# Patient Record
Sex: Female | Born: 1940 | Race: White | Hispanic: No | Marital: Married | Smoking: Never smoker
Health system: Southern US, Community
[De-identification: ages and names within clinical notes are randomized; demographics above are authoritative.]

## PROBLEM LIST (undated history)

## (undated) DIAGNOSIS — K219 Gastro-esophageal reflux disease without esophagitis: Secondary | ICD-10-CM

## (undated) DIAGNOSIS — D72819 Decreased white blood cell count, unspecified: Secondary | ICD-10-CM

## (undated) DIAGNOSIS — R748 Abnormal levels of other serum enzymes: Secondary | ICD-10-CM

## (undated) DIAGNOSIS — D649 Anemia, unspecified: Secondary | ICD-10-CM

## (undated) DIAGNOSIS — E785 Hyperlipidemia, unspecified: Secondary | ICD-10-CM

## (undated) DIAGNOSIS — M810 Age-related osteoporosis without current pathological fracture: Secondary | ICD-10-CM

## (undated) DIAGNOSIS — J309 Allergic rhinitis, unspecified: Secondary | ICD-10-CM

## (undated) DIAGNOSIS — G510 Bell's palsy: Secondary | ICD-10-CM

## (undated) DIAGNOSIS — J189 Pneumonia, unspecified organism: Secondary | ICD-10-CM

## (undated) DIAGNOSIS — I10 Essential (primary) hypertension: Secondary | ICD-10-CM

## (undated) HISTORY — PX: OTHER SURGICAL HISTORY: SHX169

## (undated) HISTORY — PX: NASAL FRACTURE SURGERY: SHX718

## (undated) HISTORY — PX: TUBAL LIGATION: SHX77

## (undated) HISTORY — PX: COLONOSCOPY: SHX174

## (undated) HISTORY — PX: TONSILLECTOMY: SUR1361

---

## 2005-08-22 ENCOUNTER — Ambulatory Visit: Payer: Self-pay | Admitting: Internal Medicine

## 2006-05-07 ENCOUNTER — Ambulatory Visit: Payer: Self-pay | Admitting: Internal Medicine

## 2006-10-03 ENCOUNTER — Ambulatory Visit: Payer: Self-pay | Admitting: Internal Medicine

## 2006-10-10 ENCOUNTER — Ambulatory Visit: Payer: Self-pay | Admitting: Internal Medicine

## 2008-01-20 ENCOUNTER — Ambulatory Visit: Payer: Self-pay | Admitting: Internal Medicine

## 2009-01-25 ENCOUNTER — Ambulatory Visit: Payer: Self-pay | Admitting: Internal Medicine

## 2010-03-15 ENCOUNTER — Ambulatory Visit: Payer: Self-pay | Admitting: Internal Medicine

## 2010-03-16 ENCOUNTER — Ambulatory Visit: Payer: Self-pay | Admitting: Internal Medicine

## 2010-06-27 ENCOUNTER — Ambulatory Visit: Payer: Self-pay | Admitting: Unknown Physician Specialty

## 2011-03-19 ENCOUNTER — Ambulatory Visit: Payer: Self-pay | Admitting: Internal Medicine

## 2012-03-20 ENCOUNTER — Ambulatory Visit: Payer: Self-pay | Admitting: Internal Medicine

## 2013-03-23 ENCOUNTER — Ambulatory Visit: Payer: Self-pay | Admitting: Internal Medicine

## 2014-03-24 ENCOUNTER — Ambulatory Visit: Payer: Self-pay | Admitting: Internal Medicine

## 2015-03-11 ENCOUNTER — Other Ambulatory Visit: Payer: Self-pay | Admitting: Internal Medicine

## 2015-03-11 DIAGNOSIS — Z1231 Encounter for screening mammogram for malignant neoplasm of breast: Secondary | ICD-10-CM

## 2015-03-28 ENCOUNTER — Ambulatory Visit
Admission: RE | Admit: 2015-03-28 | Discharge: 2015-03-28 | Disposition: A | Discharge status: Home / Self Care | Payer: BC Managed Care – PPO | Source: Ambulatory Visit | Attending: Internal Medicine | Admitting: Internal Medicine

## 2015-03-28 ENCOUNTER — Other Ambulatory Visit: Payer: Self-pay | Admitting: Internal Medicine

## 2015-03-28 DIAGNOSIS — Z1231 Encounter for screening mammogram for malignant neoplasm of breast: Secondary | ICD-10-CM | POA: Diagnosis not present

## 2016-02-28 ENCOUNTER — Other Ambulatory Visit: Payer: Self-pay | Admitting: Internal Medicine

## 2016-02-28 DIAGNOSIS — Z1231 Encounter for screening mammogram for malignant neoplasm of breast: Secondary | ICD-10-CM

## 2016-03-29 ENCOUNTER — Ambulatory Visit: Payer: Medicare Other

## 2016-03-29 ENCOUNTER — Ambulatory Visit
Admission: RE | Admit: 2016-03-29 | Discharge: 2016-03-29 | Disposition: A | Discharge status: Home / Self Care | Payer: BC Managed Care – PPO | Source: Ambulatory Visit | Attending: Internal Medicine | Admitting: Internal Medicine

## 2016-03-29 DIAGNOSIS — Z1231 Encounter for screening mammogram for malignant neoplasm of breast: Secondary | ICD-10-CM | POA: Insufficient documentation

## 2016-04-18 ENCOUNTER — Encounter: Payer: Self-pay | Admitting: *Deleted

## 2016-04-24 ENCOUNTER — Encounter: Payer: Self-pay | Admitting: *Deleted

## 2016-04-24 ENCOUNTER — Ambulatory Visit
Admission: RE | Admit: 2016-04-24 | Discharge: 2016-04-24 | Disposition: A | Discharge status: Home / Self Care | Payer: BC Managed Care – PPO | Source: Ambulatory Visit | Attending: Ophthalmology | Admitting: Ophthalmology

## 2016-04-24 ENCOUNTER — Ambulatory Visit: Payer: BC Managed Care – PPO | Admitting: Anesthesiology

## 2016-04-24 ENCOUNTER — Encounter
Admission: RE | Disposition: A | Discharge status: Home / Self Care | Payer: Self-pay | Source: Ambulatory Visit | Attending: Ophthalmology

## 2016-04-24 DIAGNOSIS — K219 Gastro-esophageal reflux disease without esophagitis: Secondary | ICD-10-CM | POA: Diagnosis not present

## 2016-04-24 DIAGNOSIS — H2512 Age-related nuclear cataract, left eye: Secondary | ICD-10-CM | POA: Insufficient documentation

## 2016-04-24 DIAGNOSIS — I10 Essential (primary) hypertension: Secondary | ICD-10-CM | POA: Diagnosis not present

## 2016-04-24 DIAGNOSIS — Z862 Personal history of diseases of the blood and blood-forming organs and certain disorders involving the immune mechanism: Secondary | ICD-10-CM | POA: Insufficient documentation

## 2016-04-24 HISTORY — DX: Gastro-esophageal reflux disease without esophagitis: K21.9

## 2016-04-24 HISTORY — DX: Essential (primary) hypertension: I10

## 2016-04-24 HISTORY — PX: CATARACT EXTRACTION W/PHACO: SHX586

## 2016-04-24 HISTORY — DX: Anemia, unspecified: D64.9

## 2016-04-24 SURGERY — PHACOEMULSIFICATION, CATARACT, WITH IOL INSERTION
Anesthesia: Monitor Anesthesia Care | Site: Eye | Laterality: Left | Wound class: Clean

## 2016-04-24 MED ORDER — CEFUROXIME OPHTHALMIC INJECTION 1 MG/0.1 ML
INJECTION | OPHTHALMIC | Status: DC | PRN
  Administered 2016-04-24: 0.1 mL via INTRACAMERAL

## 2016-04-24 MED ORDER — NA CHONDROIT SULF-NA HYALURON 40-17 MG/ML IO SOLN
INTRAOCULAR | Status: AC
  Filled 2016-04-24: qty 1

## 2016-04-24 MED ORDER — MOXIFLOXACIN HCL 0.5 % OP SOLN
OPHTHALMIC | Status: DC | PRN
  Administered 2016-04-24: 1 [drp] via OPHTHALMIC

## 2016-04-24 MED ORDER — EPINEPHRINE HCL 1 MG/ML IJ SOLN
INTRAOCULAR | Status: DC | PRN
  Administered 2016-04-24: 10:00:00 via OPHTHALMIC

## 2016-04-24 MED ORDER — FENTANYL CITRATE (PF) 100 MCG/2ML IJ SOLN
INTRAMUSCULAR | Status: DC | PRN
  Administered 2016-04-24: 50 ug via INTRAVENOUS

## 2016-04-24 MED ORDER — EPINEPHRINE HCL 1 MG/ML IJ SOLN
INTRAMUSCULAR | Status: AC
  Filled 2016-04-24: qty 1

## 2016-04-24 MED ORDER — ARMC OPHTHALMIC DILATING GEL
1.0000 "application " | OPHTHALMIC | Status: AC | PRN
  Administered 2016-04-24 (×2): 1 via OPHTHALMIC

## 2016-04-24 MED ORDER — POVIDONE-IODINE 5 % OP SOLN
1.0000 "application " | Freq: Once | OPHTHALMIC | Status: AC
  Administered 2016-04-24: 1 via OPHTHALMIC

## 2016-04-24 MED ORDER — POVIDONE-IODINE 5 % OP SOLN
OPHTHALMIC | Status: AC
  Administered 2016-04-24: 1 via OPHTHALMIC
  Filled 2016-04-24: qty 30

## 2016-04-24 MED ORDER — MOXIFLOXACIN HCL 0.5 % OP SOLN: 1.0000 [drp] | OPHTHALMIC | Status: DC | PRN

## 2016-04-24 MED ORDER — NA CHONDROIT SULF-NA HYALURON 40-17 MG/ML IO SOLN
INTRAOCULAR | Status: DC | PRN
  Administered 2016-04-24: 1 mL via INTRAOCULAR

## 2016-04-24 MED ORDER — MOXIFLOXACIN HCL 0.5 % OP SOLN
OPHTHALMIC | Status: AC
  Filled 2016-04-24: qty 3

## 2016-04-24 MED ORDER — TETRACAINE HCL 0.5 % OP SOLN
1.0000 [drp] | Freq: Once | OPHTHALMIC | Status: AC
  Administered 2016-04-24: 1 [drp] via OPHTHALMIC

## 2016-04-24 MED ORDER — MIDAZOLAM HCL 2 MG/2ML IJ SOLN
INTRAMUSCULAR | Status: DC | PRN
  Administered 2016-04-24 (×2): 1 mg via INTRAVENOUS

## 2016-04-24 MED ORDER — CARBACHOL 0.01 % IO SOLN
INTRAOCULAR | Status: DC | PRN
  Administered 2016-04-24: 0.5 mL via INTRAOCULAR

## 2016-04-24 MED ORDER — CEFUROXIME OPHTHALMIC INJECTION 1 MG/0.1 ML
INJECTION | OPHTHALMIC | Status: AC
  Filled 2016-04-24: qty 0.1

## 2016-04-24 MED ORDER — SODIUM CHLORIDE 0.9 % IV SOLN
INTRAVENOUS | Status: DC
  Administered 2016-04-24 (×2): via INTRAVENOUS

## 2016-04-24 MED ORDER — TETRACAINE HCL 0.5 % OP SOLN
OPHTHALMIC | Status: AC
  Administered 2016-04-24: 1 [drp] via OPHTHALMIC
  Filled 2016-04-24: qty 2

## 2016-04-24 SURGICAL SUPPLY — 21 items
CANNULA ANT/CHMB 27GA (MISCELLANEOUS) ×2 IMPLANT
CUP MEDICINE 2OZ PLAST GRAD ST (MISCELLANEOUS) ×2 IMPLANT
GLOVE BIO SURGEON STRL SZ8 (GLOVE) ×2 IMPLANT
GLOVE BIOGEL M 6.5 STRL (GLOVE) ×2 IMPLANT
GLOVE SURG LX 8.0 MICRO (GLOVE) ×1
GLOVE SURG LX STRL 8.0 MICRO (GLOVE) ×1 IMPLANT
GOWN STRL REUS W/ TWL LRG LVL3 (GOWN DISPOSABLE) ×2 IMPLANT
GOWN STRL REUS W/TWL LRG LVL3 (GOWN DISPOSABLE) ×2
LENS IOL TECNIS SYMFONY 18.0 (Intraocular Lens) ×2 IMPLANT
PACK CATARACT (MISCELLANEOUS) ×2 IMPLANT
PACK CATARACT BRASINGTON LX (MISCELLANEOUS) ×2 IMPLANT
PACK EYE AFTER SURG (MISCELLANEOUS) ×2 IMPLANT
SOL BSS BAG (MISCELLANEOUS) ×2
SOL PREP PVP 2OZ (MISCELLANEOUS) ×2
SOLUTION BSS BAG (MISCELLANEOUS) ×1 IMPLANT
SOLUTION PREP PVP 2OZ (MISCELLANEOUS) ×1 IMPLANT
SYR 3ML LL SCALE MARK (SYRINGE) ×2 IMPLANT
SYR 5ML LL (SYRINGE) ×2 IMPLANT
SYR TB 1ML 27GX1/2 LL (SYRINGE) ×2 IMPLANT
WATER STERILE IRR 1000ML POUR (IV SOLUTION) ×2 IMPLANT
WIPE NON LINTING 3.25X3.25 (MISCELLANEOUS) ×2 IMPLANT

## 2016-04-24 NOTE — Discharge Instructions (Signed)
Eye Surgery Discharge Instructions  Expect mild scratchy sensation or mild soreness. DO NOT RUB YOUR EYE!  The day of surgery:  Minimal physical activity, but bed rest is not required  No reading, computer work, or close hand work  No bending, lifting, or straining.  May watch TV  For 24 hours:  No driving, legal decisions, or alcoholic beverages  Safety precautions  Eat anything you prefer: It is better to start with liquids, then soup then solid foods.  _____ Eye patch should be worn until postoperative exam tomorrow.  ____ Solar shield eyeglasses should be worn for comfort in the sunlight/patch while sleeping  Resume all regular medications including aspirin or Coumadin if these were discontinued prior to surgery. You may shower, bathe, shave, or wash your hair. Tylenol may be taken for mild discomfort.  Call your doctor if you experience significant pain, nausea, or vomiting, fever > 101 or other signs of infection. [phone] or [phone] Specific instructions:  Follow-up Information    Follow up with PORFILIO,WILLIAM LOUIS, MD In 1 day.   Specialty:  Ophthalmology   Why:  May 31` at 8:30am   Contact information:   [address] [place] [address] [phone]

## 2016-04-24 NOTE — Transfer of Care (Signed)
Immediate Anesthesia Transfer of Care Note  Patient: Shelly Heath  Procedure(s) Performed: Procedure(s) with comments: CATARACT EXTRACTION PHACO AND INTRAOCULAR LENS PLACEMENT (IOC) (Left) - US 50.9 AP% 16.3 CDE 8.32 Fluid Pack Lot # [id number]  Patient Location: PACU  Anesthesia Type:MAC  Level of Consciousness: awake, alert  and oriented  Airway & Oxygen Therapy: Patient Spontanous Breathing  Post-op Assessment: Report given to RN and Post -op Vital signs reviewed and stable  Post vital signs: Reviewed and stable  Last Vitals:  Filed Vitals:   04/24/16 0907  BP: 166/61  Pulse: 60  Temp: 36.9 C  Resp: 16    Last Pain: There were no vitals filed for this visit.       Complications: No apparent anesthesia complications

## 2016-04-24 NOTE — Anesthesia Postprocedure Evaluation (Signed)
Anesthesia Post Note  Patient: Shelly DawsonJudy W Heath  Procedure(s) Performed: Procedure(s) (LRB): CATARACT EXTRACTION PHACO AND INTRAOCULAR LENS PLACEMENT (IOC) (Left)  Patient location during evaluation: PACU Anesthesia Type: MAC Level of consciousness: awake and alert Pain management: pain level controlled Vital Signs Assessment: post-procedure vital signs reviewed and stable Respiratory status: spontaneous breathing, nonlabored ventilation, respiratory function stable and patient connected to nasal cannula oxygen Cardiovascular status: stable and blood pressure returned to baseline Anesthetic complications: no    Last Vitals:  Filed Vitals:   04/24/16 1037 04/24/16 1048  BP: 157/53 146/88  Pulse: 55   Temp: 36.4 C   Resp: 16     Last Pain: There were no vitals filed for this visit.               Cleda MccreedyJoseph K Piscitello

## 2016-04-24 NOTE — H&P (Signed)
  All labs reviewed. Abnormal studies sent to patients PCP when indicated.  Previous H&P reviewed, patient examined, there are NO CHANGES.  Shelly Heath LOUIS5/30/201710:06 AM

## 2016-04-24 NOTE — Op Note (Signed)
PREOPERATIVE DIAGNOSIS:  Nuclear sclerotic cataract of the left eye.   POSTOPERATIVE DIAGNOSIS:  NUCLEAR SCLEROTIC CATARACT LEFT EYE   OPERATIVE PROCEDURE:  Procedure(s): CATARACT EXTRACTION PHACO AND INTRAOCULAR LENS PLACEMENT (IOC)   SURGEON:  Galen ManilaWilliam Redell Nazir, MD.   ANESTHESIA:   Anesthesiologist: Rosaria FerriesJoseph K Piscitello, MD CRNA: Evelena Peateborah Ferrero-Conover, CRNA  1.      Managed anesthesia care. 2.      Topical tetracaine drops followed by 2% Xylocaine jelly applied in the preoperative holding area.   COMPLICATIONS:  None.   TECHNIQUE:   Stop and chop   DESCRIPTION OF PROCEDURE:  The patient was examined and consented in the preoperative holding area where the aforementioned topical anesthesia was applied to the left eye and then brought back to the Operating Room where the left eye was prepped and draped in the usual sterile ophthalmic fashion and a lid speculum was placed. A paracentesis was created with the side port blade and the anterior chamber was filled with viscoelastic. A near clear corneal incision was performed with the steel keratome. A continuous curvilinear capsulorrhexis was performed with a cystotome followed by the capsulorrhexis forceps. Hydrodissection and hydrodelineation were carried out with BSS on a blunt cannula. The lens was removed in a stop and chop  technique and the remaining cortical material was removed with the irrigation-aspiration handpiece. The capsular bag was inflated with viscoelastic and the Technis ZCB00 lens was placed in the capsular bag without complication. The remaining viscoelastic was removed from the eye with the irrigation-aspiration handpiece. The wounds were hydrated. The anterior chamber was flushed with Miostat and the eye was inflated to physiologic pressure. 0.1 mL of cefuroxime concentration 10 mg/mL was placed in the anterior chamber. The wounds were found to be water tight. The eye was dressed with Vigamox. The patient was given protective  glasses to wear throughout the day and a shield with which to sleep tonight. The patient was also given drops with which to begin a drop regimen today and will follow-up with me in one day.  Implant Name Type Inv. Item Serial No. Manufacturer Lot No. LRB No. Used  LENS IOL SYMFONY 18.0 - [id number] Intraocular Lens LENS IOL SYMFONY 18.0 [id number] AMO   Left 1   Procedure(s) with comments: CATARACT EXTRACTION PHACO AND INTRAOCULAR LENS PLACEMENT (IOC) (Left) - US 50.9 AP% 16.3 CDE 8.32 Fluid Pack Lot # [id number] H  Electronically signed: Adenike Shidler LOUIS 04/24/2016 10:35 AM

## 2016-04-24 NOTE — Anesthesia Preprocedure Evaluation (Signed)
Anesthesia Evaluation  Patient identified by MRN, date of birth, ID band Patient awake    Reviewed: Allergy & Precautions, H&P , NPO status , Patient's Chart, lab work & pertinent test results  History of Anesthesia Complications Negative for: history of anesthetic complications  Airway Mallampati: III  TM Distance: <3 FB Neck ROM: limited    Dental  (+) Poor Dentition, Chipped, Caps   Pulmonary neg pulmonary ROS, neg shortness of breath,    Pulmonary exam normal breath sounds clear to auscultation       Cardiovascular Exercise Tolerance: Good hypertension, (-) angina(-) Past MI and (-) DOE Normal cardiovascular exam Rhythm:regular Rate:Normal     Neuro/Psych negative neurological ROS  negative psych ROS   GI/Hepatic Neg liver ROS, GERD  Controlled,  Endo/Other  negative endocrine ROS  Renal/GU negative Renal ROS  negative genitourinary   Musculoskeletal   Abdominal   Peds  Hematology negative hematology ROS (+)   Anesthesia Other Findings Past Medical History:   Hypertension                                                 GERD (gastroesophageal reflux disease)                       Anemia                                                      Past Surgical History:   smr                                                           TUBAL LIGATION                                                COLONOSCOPY                                                  BMI    Body Mass Index   23.16 kg/m 2      Reproductive/Obstetrics negative OB ROS                             Anesthesia Physical Anesthesia Plan  ASA: III  Anesthesia Plan: MAC   Post-op Pain Management:    Induction:   Airway Management Planned:   Additional Equipment:   Intra-op Plan:   Post-operative Plan:   Informed Consent: I have reviewed the patients History and Physical, chart, labs and discussed the procedure  including the risks, benefits and alternatives for the proposed anesthesia with the patient or authorized representative who has indicated his/her understanding and acceptance.   Dental Advisory Given  Plan Discussed with:  Anesthesiologist, CRNA and Surgeon  Anesthesia Plan Comments:         Anesthesia Quick Evaluation

## 2016-05-07 ENCOUNTER — Encounter: Payer: Self-pay | Admitting: Anesthesiology

## 2016-05-07 ENCOUNTER — Ambulatory Visit
Admission: RE | Admit: 2016-05-07 | Discharge: 2016-05-07 | Disposition: A | Discharge status: Home / Self Care | Payer: BC Managed Care – PPO | Source: Ambulatory Visit | Attending: Unknown Physician Specialty | Admitting: Unknown Physician Specialty

## 2016-05-07 ENCOUNTER — Ambulatory Visit: Payer: BC Managed Care – PPO | Admitting: Anesthesiology

## 2016-05-07 ENCOUNTER — Encounter
Admission: RE | Disposition: A | Discharge status: Home / Self Care | Payer: Self-pay | Source: Ambulatory Visit | Attending: Unknown Physician Specialty

## 2016-05-07 DIAGNOSIS — I10 Essential (primary) hypertension: Secondary | ICD-10-CM | POA: Diagnosis not present

## 2016-05-07 DIAGNOSIS — Z7982 Long term (current) use of aspirin: Secondary | ICD-10-CM | POA: Diagnosis not present

## 2016-05-07 DIAGNOSIS — Z86010 Personal history of colonic polyps: Secondary | ICD-10-CM | POA: Diagnosis not present

## 2016-05-07 DIAGNOSIS — Z79899 Other long term (current) drug therapy: Secondary | ICD-10-CM | POA: Insufficient documentation

## 2016-05-07 DIAGNOSIS — K219 Gastro-esophageal reflux disease without esophagitis: Secondary | ICD-10-CM | POA: Diagnosis not present

## 2016-05-07 DIAGNOSIS — K5730 Diverticulosis of large intestine without perforation or abscess without bleeding: Secondary | ICD-10-CM | POA: Insufficient documentation

## 2016-05-07 DIAGNOSIS — Z801 Family history of malignant neoplasm of trachea, bronchus and lung: Secondary | ICD-10-CM | POA: Diagnosis not present

## 2016-05-07 DIAGNOSIS — Z800 Family history of malignant neoplasm of digestive organs: Secondary | ICD-10-CM | POA: Insufficient documentation

## 2016-05-07 DIAGNOSIS — Z1211 Encounter for screening for malignant neoplasm of colon: Secondary | ICD-10-CM | POA: Insufficient documentation

## 2016-05-07 DIAGNOSIS — E785 Hyperlipidemia, unspecified: Secondary | ICD-10-CM | POA: Insufficient documentation

## 2016-05-07 DIAGNOSIS — K648 Other hemorrhoids: Secondary | ICD-10-CM | POA: Diagnosis not present

## 2016-05-07 DIAGNOSIS — Z8041 Family history of malignant neoplasm of ovary: Secondary | ICD-10-CM | POA: Diagnosis not present

## 2016-05-07 DIAGNOSIS — M810 Age-related osteoporosis without current pathological fracture: Secondary | ICD-10-CM | POA: Diagnosis not present

## 2016-05-07 HISTORY — DX: Decreased white blood cell count, unspecified: D72.819

## 2016-05-07 HISTORY — DX: Hyperlipidemia, unspecified: E78.5

## 2016-05-07 HISTORY — DX: Age-related osteoporosis without current pathological fracture: M81.0

## 2016-05-07 HISTORY — DX: Allergic rhinitis, unspecified: J30.9

## 2016-05-07 HISTORY — DX: Abnormal levels of other serum enzymes: R74.8

## 2016-05-07 HISTORY — PX: COLONOSCOPY: SHX5424

## 2016-05-07 HISTORY — DX: Bell's palsy: G51.0

## 2016-05-07 SURGERY — COLONOSCOPY
Anesthesia: General

## 2016-05-07 MED ORDER — MIDAZOLAM HCL 5 MG/5ML IJ SOLN
INTRAMUSCULAR | Status: DC | PRN
  Administered 2016-05-07: .5 mg via INTRAVENOUS
  Administered 2016-05-07: 0.5 mg via INTRAVENOUS

## 2016-05-07 MED ORDER — LIDOCAINE HCL (PF) 1 % IJ SOLN
INTRAMUSCULAR | Status: AC
  Administered 2016-05-07: 0.03 mL via INTRADERMAL
  Filled 2016-05-07: qty 2

## 2016-05-07 MED ORDER — SODIUM CHLORIDE 0.9 % IV SOLN
INTRAVENOUS | Status: DC
  Administered 2016-05-07: 1000 mL via INTRAVENOUS
  Administered 2016-05-07 (×2): via INTRAVENOUS

## 2016-05-07 MED ORDER — EPHEDRINE SULFATE 50 MG/ML IJ SOLN
INTRAMUSCULAR | Status: DC | PRN
  Administered 2016-05-07: 10 mg via INTRAVENOUS

## 2016-05-07 MED ORDER — SODIUM CHLORIDE 0.9 % IV SOLN: INTRAVENOUS | Status: DC

## 2016-05-07 MED ORDER — FENTANYL CITRATE (PF) 100 MCG/2ML IJ SOLN
INTRAMUSCULAR | Status: DC | PRN
  Administered 2016-05-07: 50 ug via INTRAVENOUS

## 2016-05-07 MED ORDER — LIDOCAINE HCL (PF) 1 % IJ SOLN
2.0000 mL | Freq: Once | INTRAMUSCULAR | Status: AC
  Administered 2016-05-07: 0.03 mL via INTRADERMAL

## 2016-05-07 MED ORDER — PROPOFOL 500 MG/50ML IV EMUL
INTRAVENOUS | Status: DC | PRN
  Administered 2016-05-07: 180 ug/kg/min via INTRAVENOUS

## 2016-05-07 NOTE — Anesthesia Postprocedure Evaluation (Signed)
Anesthesia Post Note  Patient: Shelly DawsonJudy W Barraclough  Procedure(s) Performed: Procedure(s) (LRB): COLONOSCOPY (N/A)  Patient location during evaluation: Endoscopy Anesthesia Type: General Level of consciousness: awake and alert Pain management: pain level controlled Vital Signs Assessment: post-procedure vital signs reviewed and stable Respiratory status: spontaneous breathing, nonlabored ventilation, respiratory function stable and patient connected to nasal cannula oxygen Cardiovascular status: blood pressure returned to baseline and stable Postop Assessment: no signs of nausea or vomiting Anesthetic complications: no    Last Vitals:  Filed Vitals:   05/07/16 1452 05/07/16 1503  BP: 119/62 128/60  Pulse: 63 65  Temp:    Resp: 12 18    Last Pain: There were no vitals filed for this visit.               Lenard SimmerAndrew Daiveon Markman

## 2016-05-07 NOTE — Op Note (Signed)
[facility] Gastroenterology Patient Name: Shelly Heath Procedure Date: 05/07/2016 1:41 PM MRN: [id number] Account #: [id number] Date of Birth: 04/02/1941 Admit Type: Outpatient Age: 75 Room: [facility] ENDO ROOM 1 Gender: Female Note Status: Finalized Procedure:            Colonoscopy Indications:          High risk colon cancer surveillance: Personal history                        of colonic polyps Providers:            Scot Jun, MD Referring MD:         No Local Md, MD (Referring MD) Medicines:            Propofol per Anesthesia Complications:        No immediate complications. Procedure:            Pre-Anesthesia Assessment:                       - After reviewing the risks and benefits, the patient                        was deemed in satisfactory condition to undergo the                        procedure.                       After obtaining informed consent, the colonoscope was                        passed under direct vision. Throughout the procedure,                        the patient's blood pressure, pulse, and oxygen                        saturations were monitored continuously. The                        Colonoscope was introduced through the anus and                        advanced to the the cecum, identified by appendiceal                        orifice and ileocecal valve. The colonoscopy was                        somewhat difficult due to a tortuous colon. Successful                        completion of the procedure was aided by applying                        abdominal pressure. The patient tolerated the procedure                        well. The quality of the bowel preparation was good. Findings:      A few small-mouthed diverticula were found in the sigmoid colon.  Internal hemorrhoids were found during endoscopy. The hemorrhoids were       small and Grade I (internal hemorrhoids that do not prolapse).      The exam was otherwise  without abnormality. Impression:           - Diverticulosis in the sigmoid colon.                       - Internal hemorrhoids.                       - The examination was otherwise normal.                       - No specimens collected. Recommendation:       - Repeat colonoscopy in 5 years for surveillance. Scot Junobert T Cedrik Heindl, MD 05/07/2016 2:14:33 PM This report has been signed electronically. Number of Addenda: 0 Note Initiated On: 05/07/2016 1:41 PM Scope Withdrawal Time: 0 hours 10 minutes 8 seconds  Total Procedure Duration: 0 hours 19 minutes 12 seconds       [facility]

## 2016-05-07 NOTE — Transfer of Care (Signed)
Immediate Anesthesia Transfer of Care Note  Patient: Shelly DawsonJudy W Halloran  Procedure(s) Performed: Procedure(s): COLONOSCOPY (N/A)  Patient Location: PACU  Anesthesia Type:General  Level of Consciousness: sedated  Airway & Oxygen Therapy: Patient Spontanous Breathing and Patient connected to nasal cannula oxygen  Post-op Assessment: Report given to RN and Post -op Vital signs reviewed and stable  Post vital signs: Reviewed and stable  Last Vitals:  Filed Vitals:   05/07/16 1237  BP: 151/87  Temp: 36 C  Resp: 16    Last Pain: There were no vitals filed for this visit.       Complications: No apparent anesthesia complications

## 2016-05-07 NOTE — Anesthesia Preprocedure Evaluation (Signed)
Anesthesia Evaluation  Patient identified by MRN, date of birth, ID band Patient awake    Reviewed: Allergy & Precautions, H&P , NPO status , Patient's Chart, lab work & pertinent test results, reviewed documented beta blocker date and time   History of Anesthesia Complications Negative for: history of anesthetic complications  Airway Mallampati: III  TM Distance: >3 FB Neck ROM: full    Dental no notable dental hx. (+) Caps, Teeth Intact   Pulmonary neg pulmonary ROS,    Pulmonary exam normal breath sounds clear to auscultation       Cardiovascular Exercise Tolerance: Good hypertension, (-) angina(-) CAD, (-) Past MI, (-) Cardiac Stents and (-) CABG Normal cardiovascular exam(-) dysrhythmias (-) Valvular Problems/Murmurs Rhythm:regular Rate:Normal     Neuro/Psych neg Seizures  Neuromuscular disease (Bell's palsy 25 years ago, resolved) negative psych ROS   GI/Hepatic Neg liver ROS, GERD  ,  Endo/Other  negative endocrine ROS  Renal/GU negative Renal ROS  negative genitourinary   Musculoskeletal   Abdominal   Peds  Hematology negative hematology ROS (+)   Anesthesia Other Findings Past Medical History:   Hypertension                                                 GERD (gastroesophageal reflux disease)                       Anemia                                                       Allergic rhinitis                                            Bell's palsy                                                 Elevated liver enzymes                                       Hyperlipidemia                                               Leukopenia                                                   Osteoporosis, post-menopausal                                Reproductive/Obstetrics negative OB ROS  Anesthesia Physical Anesthesia Plan  ASA: II  Anesthesia Plan: General    Post-op Pain Management:    Induction:   Airway Management Planned:   Additional Equipment:   Intra-op Plan:   Post-operative Plan:   Informed Consent: I have reviewed the patients History and Physical, chart, labs and discussed the procedure including the risks, benefits and alternatives for the proposed anesthesia with the patient or authorized representative who has indicated his/her understanding and acceptance.   Dental Advisory Given  Plan Discussed with: Anesthesiologist, CRNA and Surgeon  Anesthesia Plan Comments:         Anesthesia Quick Evaluation

## 2016-05-07 NOTE — H&P (Signed)
Primary Care Physician:  No PCP Per Patient Primary Gastroenterologist:  Dr. Mechele CollinElliott  Pre-Procedure History & Physical: HPI:  Shelly Heath is a 75 y.o. female is here for an colonoscopy.   Past Medical History  Diagnosis Date  . Hypertension   . GERD (gastroesophageal reflux disease)   . Anemia   . Allergic rhinitis   . Bell's palsy   . Elevated liver enzymes   . Hyperlipidemia   . Leukopenia   . Osteoporosis, post-menopausal     Past Surgical History  Procedure Laterality Date  . Smr    . Tubal ligation    . Colonoscopy    . Cataract extraction w/phaco Left 04/24/2016    Procedure: CATARACT EXTRACTION PHACO AND INTRAOCULAR LENS PLACEMENT (IOC);  Surgeon: Galen ManilaWilliam Porfilio, MD;  Location: [facility] ORS;  Service: Ophthalmology;  Laterality: Left;  US 50.9 AP% 16.3 CDE 8.32 Fluid Pack Lot # [id number]  . Nasal fracture surgery      Prior to Admission medications   Medication Sig Start Date End Date Taking? Authorizing Provider  aspirin 81 MG tablet Take 81 mg by mouth daily.    Historical Provider, MD  BROMSITE 0.075 % SOLN Place 1 drop into the right eye daily. Start the day before procedure and continue 1 week after procedure 04/19/16   Historical Provider, MD  calcium carbonate (OSCAL) 1500 (600 Ca) MG TABS tablet Take 600 mg by mouth 2 (two) times daily.    Historical Provider, MD  cetirizine (ZYRTEC) 10 MG tablet Take 10 mg by mouth daily as needed.    Historical Provider, MD  losartan (COZAAR) 25 MG tablet Take 25 mg by mouth daily.    Historical Provider, MD  simvastatin (ZOCOR) 40 MG tablet Take 40 mg by mouth at bedtime.     Historical Provider, MD    Allergies as of 04/20/2016  . (No Known Allergies)    Family History  Problem Relation Age of Onset  . Lung cancer Mother   . Esophageal cancer Father   . Ovarian cancer Sister [id number]    Social History   Social History  . Marital Status: Married    Spouse Name: N/A  . Number of Children: N/A  . Years of  Education: N/A   Occupational History  . Not on file.   Social History Main Topics  . Smoking status: Never Smoker   . Smokeless tobacco: Not on file  . Alcohol Use: No  . Drug Use: Not on file  . Sexual Activity: Not on file   Other Topics Concern  . Not on file   Social History Narrative    Review of Systems: See HPI, otherwise negative ROS  Physical Exam: BP 151/87 mmHg  Temp(Src) 96.8 F (36 C) (Tympanic)  Resp 16  Ht 5\' 4"  (1.626 m)  Wt 61.236 kg (135 lb)  BMI 23.16 kg/m2  SpO2 100% General:   Alert,  pleasant and cooperative in NAD Head:  Normocephalic and atraumatic. Neck:  Supple; no masses or thyromegaly. Lungs:  Clear throughout to auscultation.    Heart:  Regular rate and rhythm. Abdomen:  Soft, nontender and nondistended. Normal bowel sounds, without guarding, and without rebound.   Neurologic:  Alert and  oriented x4;  grossly normal neurologically.  Impression/Plan: Shelly Heath is here for an [facility]  Risks, benefits, limitations, and alternatives regarding  colonoscopy have been reviewed with the patient.  Questions have been answered.  All parties  agreeable.   Lynnae Prude, MD  05/07/2016, 1:46 PM

## 2016-05-07 NOTE — Anesthesia Procedure Notes (Signed)
Date/Time: 05/07/2016 1:40 PM Performed by: Lenard SimmerKARENZ, Billy Rocco Pre-anesthesia Checklist: Patient identified, Emergency Drugs available, Suction available, Patient being monitored and Timeout performed Patient Re-evaluated:Patient Re-evaluated prior to inductionOxygen Delivery Method: Nasal cannula Intubation Type: IV induction

## 2016-05-09 ENCOUNTER — Encounter: Payer: Self-pay | Admitting: Unknown Physician Specialty

## 2016-05-15 ENCOUNTER — Encounter: Payer: Self-pay | Admitting: *Deleted

## 2016-05-15 ENCOUNTER — Ambulatory Visit: Payer: BC Managed Care – PPO | Admitting: Anesthesiology

## 2016-05-15 ENCOUNTER — Ambulatory Visit
Admission: RE | Admit: 2016-05-15 | Discharge: 2016-05-15 | Disposition: A | Discharge status: Home / Self Care | Payer: BC Managed Care – PPO | Source: Ambulatory Visit | Attending: Ophthalmology | Admitting: Ophthalmology

## 2016-05-15 ENCOUNTER — Encounter
Admission: RE | Disposition: A | Discharge status: Home / Self Care | Payer: Self-pay | Source: Ambulatory Visit | Attending: Ophthalmology

## 2016-05-15 DIAGNOSIS — Z7982 Long term (current) use of aspirin: Secondary | ICD-10-CM | POA: Insufficient documentation

## 2016-05-15 DIAGNOSIS — D649 Anemia, unspecified: Secondary | ICD-10-CM | POA: Diagnosis not present

## 2016-05-15 DIAGNOSIS — K219 Gastro-esophageal reflux disease without esophagitis: Secondary | ICD-10-CM | POA: Insufficient documentation

## 2016-05-15 DIAGNOSIS — H2511 Age-related nuclear cataract, right eye: Secondary | ICD-10-CM | POA: Diagnosis present

## 2016-05-15 DIAGNOSIS — Z9842 Cataract extraction status, left eye: Secondary | ICD-10-CM | POA: Diagnosis not present

## 2016-05-15 DIAGNOSIS — I10 Essential (primary) hypertension: Secondary | ICD-10-CM | POA: Insufficient documentation

## 2016-05-15 DIAGNOSIS — E7800 Pure hypercholesterolemia, unspecified: Secondary | ICD-10-CM | POA: Insufficient documentation

## 2016-05-15 DIAGNOSIS — Z79899 Other long term (current) drug therapy: Secondary | ICD-10-CM | POA: Diagnosis not present

## 2016-05-15 DIAGNOSIS — M810 Age-related osteoporosis without current pathological fracture: Secondary | ICD-10-CM | POA: Diagnosis not present

## 2016-05-15 HISTORY — PX: CATARACT EXTRACTION W/PHACO: SHX586

## 2016-05-15 HISTORY — DX: Pneumonia, unspecified organism: J18.9

## 2016-05-15 SURGERY — PHACOEMULSIFICATION, CATARACT, WITH IOL INSERTION
Anesthesia: Monitor Anesthesia Care | Site: Eye | Laterality: Right | Wound class: Clean

## 2016-05-15 MED ORDER — MOXIFLOXACIN HCL 0.5 % OP SOLN
OPHTHALMIC | Status: DC | PRN
  Administered 2016-05-15: 1 [drp] via OPHTHALMIC

## 2016-05-15 MED ORDER — MOXIFLOXACIN HCL 0.5 % OP SOLN: 1.0000 [drp] | OPHTHALMIC | Status: DC | PRN

## 2016-05-15 MED ORDER — TETRACAINE HCL 0.5 % OP SOLN
1.0000 [drp] | Freq: Once | OPHTHALMIC | Status: AC
  Administered 2016-05-15: 1 [drp] via OPHTHALMIC

## 2016-05-15 MED ORDER — MOXIFLOXACIN HCL 0.5 % OP SOLN
OPHTHALMIC | Status: AC
  Filled 2016-05-15: qty 3

## 2016-05-15 MED ORDER — CEFUROXIME OPHTHALMIC INJECTION 1 MG/0.1 ML
INJECTION | OPHTHALMIC | Status: AC
  Filled 2016-05-15: qty 0.1

## 2016-05-15 MED ORDER — ARMC OPHTHALMIC DILATING GEL
OPHTHALMIC | Status: AC
  Administered 2016-05-15: 1 via OPHTHALMIC
  Filled 2016-05-15: qty 0.25

## 2016-05-15 MED ORDER — MIDAZOLAM HCL 2 MG/2ML IJ SOLN
INTRAMUSCULAR | Status: DC | PRN
  Administered 2016-05-15 (×2): 1 mg via INTRAVENOUS

## 2016-05-15 MED ORDER — NA CHONDROIT SULF-NA HYALURON 40-17 MG/ML IO SOLN
INTRAOCULAR | Status: AC
  Filled 2016-05-15: qty 1

## 2016-05-15 MED ORDER — ARMC OPHTHALMIC DILATING GEL
1.0000 "application " | OPHTHALMIC | Status: DC | PRN
  Administered 2016-05-15: 1 via OPHTHALMIC

## 2016-05-15 MED ORDER — POVIDONE-IODINE 5 % OP SOLN
OPHTHALMIC | Status: AC
  Administered 2016-05-15: 1 via OPHTHALMIC
  Filled 2016-05-15: qty 30

## 2016-05-15 MED ORDER — FENTANYL CITRATE (PF) 100 MCG/2ML IJ SOLN: 25.0000 ug | INTRAMUSCULAR | Status: DC | PRN

## 2016-05-15 MED ORDER — SODIUM CHLORIDE 0.9 % IV SOLN
INTRAVENOUS | Status: DC
  Administered 2016-05-15: 10:00:00 via INTRAVENOUS

## 2016-05-15 MED ORDER — NA CHONDROIT SULF-NA HYALURON 40-17 MG/ML IO SOLN
INTRAOCULAR | Status: DC | PRN
  Administered 2016-05-15: 1 mL via INTRAOCULAR

## 2016-05-15 MED ORDER — EPINEPHRINE HCL 1 MG/ML IJ SOLN
INTRAMUSCULAR | Status: AC
  Filled 2016-05-15: qty 1

## 2016-05-15 MED ORDER — ONDANSETRON HCL 4 MG/2ML IJ SOLN: 4.0000 mg | Freq: Once | INTRAMUSCULAR | Status: DC | PRN

## 2016-05-15 MED ORDER — CARBACHOL 0.01 % IO SOLN
INTRAOCULAR | Status: DC | PRN
  Administered 2016-05-15: .5 mL via INTRAOCULAR

## 2016-05-15 MED ORDER — TETRACAINE HCL 0.5 % OP SOLN
OPHTHALMIC | Status: AC
  Administered 2016-05-15: 1 [drp] via OPHTHALMIC
  Filled 2016-05-15: qty 2

## 2016-05-15 MED ORDER — EPINEPHRINE HCL 1 MG/ML IJ SOLN
INTRAOCULAR | Status: DC | PRN
  Administered 2016-05-15: 1 mL via OPHTHALMIC

## 2016-05-15 MED ORDER — POVIDONE-IODINE 5 % OP SOLN
1.0000 "application " | Freq: Once | OPHTHALMIC | Status: AC
  Administered 2016-05-15: 1 via OPHTHALMIC

## 2016-05-15 MED ORDER — CEFUROXIME OPHTHALMIC INJECTION 1 MG/0.1 ML
INJECTION | OPHTHALMIC | Status: DC | PRN
  Administered 2016-05-15: .1 mL via INTRACAMERAL

## 2016-05-15 SURGICAL SUPPLY — 21 items
CANNULA ANT/CHMB 27GA (MISCELLANEOUS) ×2 IMPLANT
CUP MEDICINE 2OZ PLAST GRAD ST (MISCELLANEOUS) ×2 IMPLANT
GLOVE BIO SURGEON STRL SZ8 (GLOVE) ×2 IMPLANT
GLOVE BIOGEL M 6.5 STRL (GLOVE) ×2 IMPLANT
GLOVE SURG LX 8.0 MICRO (GLOVE) ×1
GLOVE SURG LX STRL 8.0 MICRO (GLOVE) ×1 IMPLANT
GOWN STRL REUS W/ TWL LRG LVL3 (GOWN DISPOSABLE) ×2 IMPLANT
GOWN STRL REUS W/TWL LRG LVL3 (GOWN DISPOSABLE) ×2
LENS IOL TECNIS SYMFONY 18.5 (Intraocular Lens) ×2 IMPLANT
PACK CATARACT (MISCELLANEOUS) ×2 IMPLANT
PACK CATARACT BRASINGTON LX (MISCELLANEOUS) ×2 IMPLANT
PACK EYE AFTER SURG (MISCELLANEOUS) ×2 IMPLANT
SOL BSS BAG (MISCELLANEOUS) ×2
SOL PREP PVP 2OZ (MISCELLANEOUS) ×2
SOLUTION BSS BAG (MISCELLANEOUS) ×1 IMPLANT
SOLUTION PREP PVP 2OZ (MISCELLANEOUS) ×1 IMPLANT
SYR 3ML LL SCALE MARK (SYRINGE) ×2 IMPLANT
SYR 5ML LL (SYRINGE) ×2 IMPLANT
SYR TB 1ML 27GX1/2 LL (SYRINGE) ×2 IMPLANT
WATER STERILE IRR 1000ML POUR (IV SOLUTION) ×2 IMPLANT
WIPE NON LINTING 3.25X3.25 (MISCELLANEOUS) ×2 IMPLANT

## 2016-05-15 NOTE — Anesthesia Preprocedure Evaluation (Signed)
Anesthesia Evaluation  Patient identified by MRN, date of birth, ID band Patient awake    Reviewed: Allergy & Precautions, NPO status , Patient's Chart, lab work & pertinent test results  Airway Mallampati: III       Dental  (+) Teeth Intact   Pulmonary neg pulmonary ROS,    breath sounds clear to auscultation       Cardiovascular Exercise Tolerance: Good hypertension, Pt. on medications  Rhythm:Regular     Neuro/Psych    GI/Hepatic Neg liver ROS, GERD  Medicated,  Endo/Other  negative endocrine ROS  Renal/GU negative Renal ROS  negative genitourinary   Musculoskeletal   Abdominal Normal abdominal exam  (+)   Peds negative pediatric ROS (+)  Hematology negative hematology ROS (+) anemia ,   Anesthesia Other Findings   Reproductive/Obstetrics negative OB ROS                             Anesthesia Physical Anesthesia Plan  ASA: II  Anesthesia Plan: MAC   Post-op Pain Management:    Induction: Intravenous  Airway Management Planned: Natural Airway and Nasal Cannula  Additional Equipment:   Intra-op Plan:   Post-operative Plan:   Informed Consent: I have reviewed the patients History and Physical, chart, labs and discussed the procedure including the risks, benefits and alternatives for the proposed anesthesia with the patient or authorized representative who has indicated his/her understanding and acceptance.     Plan Discussed with: CRNA  Anesthesia Plan Comments:         Anesthesia Quick Evaluation

## 2016-05-15 NOTE — Discharge Instructions (Signed)
Eye Surgery Discharge Instructions  Expect mild scratchy sensation or mild soreness. DO NOT RUB YOUR EYE!  The day of surgery:  Minimal physical activity, but bed rest is not required  No reading, computer work, or close hand work  No bending, lifting, or straining.  May watch TV  For 24 hours:  No driving, legal decisions, or alcoholic beverages  Safety precautions  Eat anything you prefer: It is better to start with liquids, then soup then solid foods.  _____ Eye patch should be worn until postoperative exam tomorrow.  ____ Solar shield eyeglasses should be worn for comfort in the sunlight/patch while sleeping  Resume all regular medications including aspirin or Coumadin if these were discontinued prior to surgery. You may shower, bathe, shave, or wash your hair. Tylenol may be taken for mild discomfort.  Call your doctor if you experience significant pain, nausea, or vomiting, fever > 101 or other signs of infection. [phone] or [phone] Specific instructions:  Follow-up Information    Follow up with Carlena BjornstadPORFILIO,WILLIAM LOUIS, MD On 05/16/2016.   Specialty:  Ophthalmology   Why:  9:35   Contact information:   [address] [place] [address] [phone]

## 2016-05-15 NOTE — Op Note (Signed)
PREOPERATIVE DIAGNOSIS:  Nuclear sclerotic cataract of the right eye.   POSTOPERATIVE DIAGNOSIS: nuclear sclerotic cataract right eye   OPERATIVE PROCEDURE:  Procedure(s): CATARACT EXTRACTION PHACO AND INTRAOCULAR LENS PLACEMENT (IOC)   SURGEON:  Galen ManilaWilliam Joelyn Lover, MD.   ANESTHESIA:  Anesthesiologist: Lezlie OctaveGijsbertus F Van Staveren, MD CRNA: Edyth Gunnelslyde Gilbert  1.      Managed anesthesia care. 2.      Topical tetracaine drops followed by 2% Xylocaine jelly applied in the preoperative holding area.   COMPLICATIONS:  None.   TECHNIQUE:   Stop and chop   DESCRIPTION OF PROCEDURE:  The patient was examined and consented in the preoperative holding area where the aforementioned topical anesthesia was applied to the right eye and then brought back to the Operating Room where the right eye was prepped and draped in the usual sterile ophthalmic fashion and a lid speculum was placed. A paracentesis was created with the side port blade and the anterior chamber was filled with viscoelastic. A near clear corneal incision was performed with the steel keratome. A continuous curvilinear capsulorrhexis was performed with a cystotome followed by the capsulorrhexis forceps. Hydrodissection and hydrodelineation were carried out with BSS on a blunt cannula. The lens was removed in a stop and chop  technique and the remaining cortical material was removed with the irrigation-aspiration handpiece. The capsular bag was inflated with viscoelastic and the Technis ZCB00  lens was placed in the capsular bag without complication. The remaining viscoelastic was removed from the eye with the irrigation-aspiration handpiece. The wounds were hydrated. The anterior chamber was flushed with Miostat and the eye was inflated to physiologic pressure. 0.1 mL of cefuroxime concentration 10 mg/mL was placed in the anterior chamber. The wounds were found to be water tight. The eye was dressed with Vigamox. The patient was given protective glasses  to wear throughout the day and a shield with which to sleep tonight. The patient was also given drops with which to begin a drop regimen today and will follow-up with me in one day.  Implant Name Type Inv. Item Serial No. Manufacturer Lot No. LRB No. Used  LENS IOL SYMFONY 18.5 - [id number] Intraocular Lens LENS IOL SYMFONY 18.5 [id number] AMO   Right 1   Procedure(s) with comments: CATARACT EXTRACTION PHACO AND INTRAOCULAR LENS PLACEMENT (IOC) (Right) - US 00:43 AP% 18.8 CDE 8.19 fluid pack lot # [id number]  Electronically signed: Somaya Grassi LOUIS 05/15/2016 10:45 AM

## 2016-05-15 NOTE — Transfer of Care (Signed)
Immediate Anesthesia Transfer of Care Note  Patient: Shelly DawsonJudy W Heath  Procedure(s) Performed: Procedure(s) with comments: CATARACT EXTRACTION PHACO AND INTRAOCULAR LENS PLACEMENT (IOC) (Right) - US 00:43 AP% 18.8 CDE 8.19 fluid pack lot # [id number]  Patient Location: PACU  Anesthesia Type:MAC  Level of Consciousness: awake, alert  and oriented  Airway & Oxygen Therapy: Patient Spontanous Breathing  Post-op Assessment: Report given to RN and Post -op Vital signs reviewed and stable  Post vital signs: Reviewed and stable  Last Vitals:  Filed Vitals:   05/15/16 0914  BP: 179/48  Pulse: 56  Temp: 36.5 C  Resp: 18    Last Pain:  Filed Vitals:   05/15/16 0916  PainSc: 0-No pain         Complications: No apparent anesthesia complications

## 2016-05-15 NOTE — Anesthesia Postprocedure Evaluation (Signed)
Anesthesia Post Note  Patient: Shelly Heath  Procedure(s) Performed: Procedure(s) (LRB): CATARACT EXTRACTION PHACO AND INTRAOCULAR LENS PLACEMENT (IOC) (Right)  Patient location during evaluation: PACU Anesthesia Type: MAC Level of consciousness: awake and alert Vital Signs Assessment: post-procedure vital signs reviewed and stable Cardiovascular status: blood pressure returned to baseline and stable Postop Assessment: no headache and no backache Anesthetic complications: no    Last Vitals:  Filed Vitals:   05/15/16 0914  BP: 179/48  Pulse: 56  Temp: 36.5 C  Resp: 18    Last Pain:  Filed Vitals:   05/15/16 0916  PainSc: 0-No pain                 Naethan Bracewell C

## 2016-10-10 ENCOUNTER — Encounter: Payer: Self-pay | Admitting: *Deleted

## 2016-10-15 NOTE — Discharge Instructions (Signed)
INSTRUCTIONS FOLLOWING OCULOPLASTIC SURGERY °AMY M. FOWLER, MD ° °AFTER YOUR EYE SURGERY, THER ARE MANY THINGS THWIHC YOU, THE PATIENT, CAN DO TO ASSURE THE BEST POSSIBLE RESULT FROM YOUR OPERATION.  THIS SHEET SHOULD BE REFERRED TO WHENEVER QUESTIONS ARISE.  IF THERE ARE ANY QUESTIONS NOT ANSWERED HERE, DO NOT HESITATE TO CALL OUR OFFICE AT [phone] OR [phone].  THERE IS ALWAYS [facility] AVAILABLE TO CALL IF QUESTIONS OR PROBLEMS ARISE. ° °VISION: Your vision may be blurred and out of focus after surgery until you are able to stop using your ointment, swelling resolves and your eye(s) heal. This may take 1 to 2 weeks at the least.  If your vision becomes gradually more dim or dark, this is not normal and you need to call our office immediately. ° °EYE CARE: For the first 48 hours after surgery, use ice packs frequently - “20 minutes on, 20 minutes off” - to help reduce swelling and bruising.  Small bags of frozen peas or corn make good ice packs along with cloths soaked in ice water.  If you are wearing a patch or other type of dressing following surgery, keep this on for the amount of time specified by your doctor.  For the first week following surgery, you will need to treat your stitches with great care.  If is OK to shower, but take care to not allow soapy water to run into your eye(s) to help reduce changes of infection.  You may gently clean the eyelashes and around the eye(s) with cotton balls and sterile water, BUT DO NOT RUB THE STITCHES VIGOROUSLY.  Keeping your stitches moist with ointment will help promote healing with minimal scar formation. ° °ACTIVITY: When you leave the surgery center, you should go home, rest and be inactive.  The eye(s) may feel scratchy and keeping the eyes closed will allow for faster healing.  The first week following surgery, avoid straining (anything making the face turn red) or lifting over 20 pounds.  Additionally, avoid bending which causes your head to go below  your waist.  Using your eyes will NOT harm them, so feel free to read, watch television, use the computer, etc as desired.  Driving depends on each individual, so check with your doctor if you have questions about driving. ° °MEDICATIONS:  You will be given a prescription for an ointment to use 4 times a day on your stitches.  You can use the ointment in your eyes if they feel scratchy or irritated.  If you eyelid(s) don’t close completely when you sleep, put some ointment in your eyes before bedtime. ° °EMERGENCY: If you experience SEVERE EYE PAIN OR HEADACHE UNRELIEVED BY TYLENOL OR PERCOCET, NAUSEA OR VOMITING, WORSENING REDNESS, OR WORSENING VISION (ESPECIALLY VISION THAT WA INITIALLY BETTER) CALL [phone] OR [phone] DURING BUSINESS HOURS OR AFTER HOURS. ° °General Anesthesia, Adult, Care After °These instructions provide you with information about caring for yourself after your procedure. Your health care provider may also give you more specific instructions. Your treatment has been planned according to current medical practices, but problems sometimes occur. Call your health care provider if you have any problems or questions after your procedure. °What can I expect after the procedure? °After the procedure, it is common to have: °· Vomiting. °· A sore throat. °· Mental slowness. °It is common to feel: °· Nauseous. °· Cold or shivery. °· Sleepy. °· Tired. °· Sore or achy, even in parts of your body where you did not have surgery. °Follow   these instructions at home: °For at least 24 hours after the procedure:  °· Do not: °¨ Participate in activities where you could fall or become injured. °¨ Drive. °¨ Use heavy machinery. °¨ Drink alcohol. °¨ Take sleeping pills or medicines that cause drowsiness. °¨ Make important decisions or sign legal documents. °¨ Take care of children on your own. °· Rest. °Eating and drinking  °· If you vomit, drink water, juice, or soup when you can drink without  vomiting. °· Drink enough fluid to keep your urine clear or pale yellow. °· Make sure you have little or no nausea before eating solid foods. °· Follow the diet recommended by your health care provider. °General instructions  °· Have a responsible adult stay with you until you are awake and alert. °· Return to your normal activities as told by your health care provider. Ask your health care provider what activities are safe for you. °· Take over-the-counter and prescription medicines only as told by your health care provider. °· If you smoke, do not smoke without supervision. °· Keep all follow-up visits as told by your health care provider. This is important. °Contact a health care provider if: °· You continue to have nausea or vomiting at home, and medicines are not helpful. °· You cannot drink fluids or start eating again. °· You cannot urinate after 8-12 hours. °· You develop a skin rash. °· You have fever. °· You have increasing redness at the site of your procedure. °Get help right away if: °· You have difficulty breathing. °· You have chest pain. °· You have unexpected bleeding. °· You feel that you are having a life-threatening or urgent problem. °This information is not intended to replace advice given to you by your health care provider. Make sure you discuss any questions you have with your health care provider. °Document Released: 02/18/2001 Document Revised: 04/16/2016 Document Reviewed: 10/27/2015 °Elsevier Interactive Patient Education © 2017 [employer] ° °

## 2016-10-16 ENCOUNTER — Ambulatory Visit: Payer: BC Managed Care – PPO | Admitting: Anesthesiology

## 2016-10-16 ENCOUNTER — Ambulatory Visit
Admission: RE | Admit: 2016-10-16 | Discharge: 2016-10-16 | Disposition: A | Discharge status: Home / Self Care | Payer: BC Managed Care – PPO | Source: Ambulatory Visit | Attending: Ophthalmology | Admitting: Ophthalmology

## 2016-10-16 ENCOUNTER — Encounter
Admission: RE | Disposition: A | Discharge status: Home / Self Care | Payer: Self-pay | Source: Ambulatory Visit | Attending: Ophthalmology

## 2016-10-16 DIAGNOSIS — I10 Essential (primary) hypertension: Secondary | ICD-10-CM | POA: Insufficient documentation

## 2016-10-16 DIAGNOSIS — E785 Hyperlipidemia, unspecified: Secondary | ICD-10-CM | POA: Diagnosis not present

## 2016-10-16 DIAGNOSIS — H02403 Unspecified ptosis of bilateral eyelids: Secondary | ICD-10-CM | POA: Insufficient documentation

## 2016-10-16 DIAGNOSIS — H02831 Dermatochalasis of right upper eyelid: Secondary | ICD-10-CM | POA: Diagnosis not present

## 2016-10-16 DIAGNOSIS — H02834 Dermatochalasis of left upper eyelid: Secondary | ICD-10-CM | POA: Diagnosis present

## 2016-10-16 HISTORY — PX: BROW LIFT: SHX178

## 2016-10-16 HISTORY — PX: PTOSIS REPAIR: SHX6568

## 2016-10-16 SURGERY — BLEPHAROPLASTY
Anesthesia: Monitor Anesthesia Care | Laterality: Bilateral | Wound class: Clean

## 2016-10-16 MED ORDER — ALFENTANIL 500 MCG/ML IJ INJ
INJECTION | INTRAMUSCULAR | Status: DC | PRN
  Administered 2016-10-16: 100 ug via INTRAVENOUS
  Administered 2016-10-16: 200 ug via INTRAVENOUS
  Administered 2016-10-16: 300 ug via INTRAVENOUS
  Administered 2016-10-16: 100 ug via INTRAVENOUS

## 2016-10-16 MED ORDER — ERYTHROMYCIN 5 MG/GM OP OINT
TOPICAL_OINTMENT | OPHTHALMIC | Status: DC | PRN
  Administered 2016-10-16: 1 via OPHTHALMIC

## 2016-10-16 MED ORDER — FENTANYL CITRATE (PF) 100 MCG/2ML IJ SOLN: 25.0000 ug | INTRAMUSCULAR | Status: DC | PRN

## 2016-10-16 MED ORDER — TETRACAINE HCL 0.5 % OP SOLN
OPHTHALMIC | Status: DC | PRN
  Administered 2016-10-16: 2 [drp] via OPHTHALMIC

## 2016-10-16 MED ORDER — ACETAMINOPHEN 160 MG/5ML PO SOLN: 325.0000 mg | ORAL | Status: DC | PRN

## 2016-10-16 MED ORDER — ERYTHROMYCIN 5 MG/GM OP OINT: TOPICAL_OINTMENT | OPHTHALMIC | 3 refills | Status: DC

## 2016-10-16 MED ORDER — ONDANSETRON HCL 4 MG/2ML IJ SOLN: 4.0000 mg | Freq: Once | INTRAMUSCULAR | Status: DC | PRN

## 2016-10-16 MED ORDER — OXYCODONE-ACETAMINOPHEN 5-325 MG PO TABS: 1.0000 | ORAL_TABLET | ORAL | 0 refills | Status: DC | PRN

## 2016-10-16 MED ORDER — LACTATED RINGERS IV SOLN
INTRAVENOUS | Status: DC
  Administered 2016-10-16: 08:00:00 via INTRAVENOUS

## 2016-10-16 MED ORDER — MIDAZOLAM HCL 2 MG/2ML IJ SOLN
INTRAMUSCULAR | Status: DC | PRN
  Administered 2016-10-16 (×2): 1 mg via INTRAVENOUS

## 2016-10-16 MED ORDER — OXYCODONE HCL 5 MG PO TABS: 5.0000 mg | ORAL_TABLET | Freq: Once | ORAL | Status: DC | PRN

## 2016-10-16 MED ORDER — BSS IO SOLN
INTRAOCULAR | Status: DC | PRN
  Administered 2016-10-16: 15 mL

## 2016-10-16 MED ORDER — LIDOCAINE-EPINEPHRINE 2 %-1:100000 IJ SOLN
INTRAMUSCULAR | Status: DC | PRN
  Administered 2016-10-16: 2 mL via OPHTHALMIC

## 2016-10-16 MED ORDER — ACETAMINOPHEN 325 MG PO TABS: 325.0000 mg | ORAL_TABLET | ORAL | Status: DC | PRN

## 2016-10-16 MED ORDER — OXYCODONE HCL 5 MG/5ML PO SOLN: 5.0000 mg | Freq: Once | ORAL | Status: DC | PRN

## 2016-10-16 SURGICAL SUPPLY — 36 items
APPLICATOR COTTON TIP WD 3 STR (MISCELLANEOUS) ×4 IMPLANT
BLADE SURG 15 STRL LF DISP TIS (BLADE) ×1 IMPLANT
BLADE SURG 15 STRL SS (BLADE) ×1
CORD BIP STRL DISP 12FT (MISCELLANEOUS) ×2 IMPLANT
DRAPE HEAD BAR (DRAPES) ×2 IMPLANT
GAUZE SPONGE 4X4 12PLY STRL (GAUZE/BANDAGES/DRESSINGS) ×2 IMPLANT
GAUZE SPONGE NON-WVN 2X2 STRL (MISCELLANEOUS) ×10 IMPLANT
GLOVE SURG LX 7.0 MICRO (GLOVE) ×2
GLOVE SURG LX STRL 7.0 MICRO (GLOVE) ×2 IMPLANT
MARKER SKIN XFINE TIP W/RULER (MISCELLANEOUS) ×2 IMPLANT
NEEDLE FILTER BLUNT 18X 1/2SAF (NEEDLE) ×1
NEEDLE FILTER BLUNT 18X1 1/2 (NEEDLE) ×1 IMPLANT
NEEDLE HYPO 30X.5 LL (NEEDLE) ×4 IMPLANT
PACK DRAPE NASAL/ENT (PACKS) ×2 IMPLANT
SOL PREP PVP 2OZ (MISCELLANEOUS) ×2
SOLUTION PREP PVP 2OZ (MISCELLANEOUS) ×1 IMPLANT
SPONGE VERSALON 2X2 STRL (MISCELLANEOUS) ×10
SUT CHROMIC 4-0 (SUTURE)
SUT CHROMIC 4-0 M2 12X2 ARM (SUTURE)
SUT CHROMIC 5 0 P 3 (SUTURE) IMPLANT
SUT ETHILON 4 0 CL P 3 (SUTURE) IMPLANT
SUT MERSILENE 4-0 S-2 (SUTURE) IMPLANT
SUT PDS AB 4-0 P3 18 (SUTURE) IMPLANT
SUT PLAIN GUT (SUTURE) ×2 IMPLANT
SUT PROLENE 5 0 P 3 (SUTURE) ×2 IMPLANT
SUT PROLENE 6 0 P 1 18 (SUTURE) ×2 IMPLANT
SUT SILK 4 0 G 3 (SUTURE) IMPLANT
SUT VIC AB 5-0 P-3 18X BRD (SUTURE) IMPLANT
SUT VIC AB 5-0 P3 18 (SUTURE)
SUT VICRYL 6-0  S14 CTD (SUTURE)
SUT VICRYL 6-0 S14 CTD (SUTURE) IMPLANT
SUT VICRYL 7 0 TG140 8 (SUTURE) IMPLANT
SUTURE CHRMC 4-0 M2 12X2 ARM (SUTURE) IMPLANT
SYR 3ML LL SCALE MARK (SYRINGE) ×2 IMPLANT
SYRINGE 10CC LL (SYRINGE) ×2 IMPLANT
WATER STERILE IRR 500ML POUR (IV SOLUTION) ×2 IMPLANT

## 2016-10-16 NOTE — Anesthesia Postprocedure Evaluation (Signed)
Anesthesia Post Note  Patient: Shelly DawsonJudy W Heath  Procedure(s) Performed: Procedure(s) (LRB): BLEPHAROPLASTY (Bilateral) PTOSIS REPAIR (Bilateral)  Patient location during evaluation: PACU Anesthesia Type: MAC Level of consciousness: awake and alert and oriented Pain management: satisfactory to patient Vital Signs Assessment: post-procedure vital signs reviewed and stable Respiratory status: spontaneous breathing, nonlabored ventilation and respiratory function stable Cardiovascular status: blood pressure returned to baseline and stable Postop Assessment: Adequate PO intake and No signs of nausea or vomiting Anesthetic complications: no    Cherly BeachStella, Calani Gick J

## 2016-10-16 NOTE — Interval H&P Note (Signed)
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 After consideration of risks, benefits and other options for treatment, the patient has consented to  Procedure(s): BLEPHAROPLASTY (Bilateral) PTOSIS REPAIR (Bilateral) as a surgical intervention .  The patient's history has been reviewed, patient examined, no change in status, stable for surgery.  I have reviewed the patient's chart and labs.  Questions were answered to the patient's satisfaction.     Ether GriffinsFowler, Nohlan Burdin M

## 2016-10-16 NOTE — Anesthesia Procedure Notes (Signed)
Procedure Name: MAC Date/Time: 10/16/2016 7:43 AM Performed by: Maree KrabbeWARREN, Hoover Grewe Pre-anesthesia Checklist: Patient identified, Emergency Drugs available, Suction available, Timeout performed and Patient being monitored Patient Re-evaluated:Patient Re-evaluated prior to inductionOxygen Delivery Method: Nasal cannula Placement Confirmation: positive ETCO2

## 2016-10-16 NOTE — H&P (Signed)
See the history and physical completed at [facility] on 09/21/16 and scanned into the chart.

## 2016-10-16 NOTE — Transfer of Care (Signed)
Immediate Anesthesia Transfer of Care Note  Patient: Shelly Heath  Procedure(s) Performed: Procedure(s): BLEPHAROPLASTY (Bilateral) PTOSIS REPAIR (Bilateral)  Patient Location: PACU  Anesthesia Type: MAC  Level of Consciousness: awake, alert  and patient cooperative  Airway and Oxygen Therapy: Patient Spontanous Breathing and Patient connected to supplemental oxygen  Post-op Assessment: Post-op Vital signs reviewed, Patient's Cardiovascular Status Stable, Respiratory Function Stable, Patent Airway and No signs of Nausea or vomiting  Post-op Vital Signs: Reviewed and stable  Complications: No apparent anesthesia complications

## 2016-10-16 NOTE — Op Note (Signed)
Preoperative Diagnosis:  1. Visually significant blepharoptosis both Upper Eyelid(s) 2. Visually significant dermatochalasis both Upper Eyelid(s)  Postoperative Diagnosis:  Same.  Procedure(s) Performed:   1. Blepharoptosis repair with levator aponeurosis advancement both Upper Eyelid(s) 2. Upper eyelid blepharoplasty with excess skin excision  both Upper Eyelid(s)  Surgeon: Hubbard RobinsonAmy M. Ether GriffinsFowler, M.D.  Assistants: none  Anesthesia: MAC  Specimens: None.  Estimated Blood Loss: Minimal.  Complications: None.  Operative Findings: None Dictated  Procedure:   Allergies were reviewed and the patient Alendronate; Fish oil; Other; Raloxifene; and Risedronate.   After the risks, benefits, complications and alternatives were discussed with the patient, appropriate informed consent was obtained.  While seated in an upright position and looking in primary gaze, the mid pupillary line was marked on the upper eyelid margins bilaterally. The patient was then brought to the operating suite and reclined supine.  Timeout was conducted and the patient was sedated.  Local anesthetic consisting of a 50-50 mixture of 2% lidocaine with epinephrine and 0.75% bupivacaine with added Hylenex was injected subcutaneously to both upper eyelid(s). After adequate local was instilled, the patient was prepped and draped in the usual sterile fashion for eyelid surgery.   Attention was turned to the upper eyelids. A 9mm upper eyelid crease incision line was marked with calipers on both upper eyelid(s).  A pinch test was used to estimate the amount of excess skin to remove and this was marked in standard blepharoplasty style fashion. Attention was turned to the  right upper eyelid. A #15 blade was used to open the premarked incision line. A skin only flap was excised and hemostasis was obtained with bipolar cautery.   Westcott scissors were then used to transect through orbicularis down to the tarsal plate. Epitarsus was  dissected to create a smooth surface to suture to. Dissection was then carried superiorly in the plane between orbicularis and orbital septum. Once the preaponeurotic fat pocket was identified, the orbital septum was opened. This revealed the levator and its aponeurosis.    Attention was then turned to the opposite eyelid where the same procedure was performed in the same manner. Hemostasis was obtained with bipolar cautery throughout.   3 interrupted 6-0 Prolene sutures were then passed partial thickness through the tarsal plates of both upper eyelid(s). These sutures were placed in line with the mid pupillary, medial limbal, and lateral limbal lines. The sutures were fixed to the levator aponeurosis and adjusted until a nice lid height and contour were achieved. Once nice symmetry was achieved, the skin incisions were closed with a running 6-0 fast absorbing plain suture. The patient tolerated the procedure well.  Erythromycin ophthalmic ointment was applied to the incision site(s) followed by ice packs. The patient was taken to the recovery area where she recovered without difficulty.  Post-Op Plan/Instructions:  The patient was instructed to use ice packs frequently for the next 48 hours. She was instructed to use erythromycin ophthalmic ointment on her incisions 4 times a day for the next 12 to 14 days. Shewas given a prescription for Percocet for pain control should Tylenol not be effective. She was asked to to follow up at the [facility] in [place] in 2 weeks' time or sooner as needed for problems.  Jacobi Ryant M. Ether GriffinsFowler, M.D. Attending,Ophthalmology

## 2016-10-16 NOTE — Anesthesia Preprocedure Evaluation (Signed)
Anesthesia Evaluation  Patient identified by MRN, date of birth, ID band Patient awake    Reviewed: Allergy & Precautions, H&P , NPO status , Patient's Chart, lab work & pertinent test results  Airway Mallampati: II  TM Distance: >3 FB Neck ROM: full    Dental no notable dental hx.    Pulmonary    Pulmonary exam normal        Cardiovascular hypertension, Normal cardiovascular exam     Neuro/Psych    GI/Hepatic GERD  ,  Endo/Other    Renal/GU      Musculoskeletal   Abdominal   Peds  Hematology   Anesthesia Other Findings   Reproductive/Obstetrics                             Anesthesia Physical Anesthesia Plan  ASA: II  Anesthesia Plan: MAC   Post-op Pain Management:    Induction:   Airway Management Planned:   Additional Equipment:   Intra-op Plan:   Post-operative Plan:   Informed Consent: I have reviewed the patients History and Physical, chart, labs and discussed the procedure including the risks, benefits and alternatives for the proposed anesthesia with the patient or authorized representative who has indicated his/her understanding and acceptance.     Plan Discussed with:   Anesthesia Plan Comments:         Anesthesia Quick Evaluation  

## 2016-10-17 ENCOUNTER — Encounter: Payer: Self-pay | Admitting: Ophthalmology

## 2017-02-11 ENCOUNTER — Emergency Department
Admission: EM | Admit: 2017-02-11 | Discharge: 2017-02-11 | Disposition: A | Discharge status: Home / Self Care | Payer: Worker's Compensation | Attending: Emergency Medicine | Admitting: Emergency Medicine

## 2017-02-11 ENCOUNTER — Emergency Department: Payer: Worker's Compensation

## 2017-02-11 ENCOUNTER — Encounter: Payer: Self-pay | Admitting: Emergency Medicine

## 2017-02-11 DIAGNOSIS — I10 Essential (primary) hypertension: Secondary | ICD-10-CM | POA: Insufficient documentation

## 2017-02-11 DIAGNOSIS — Y9289 Other specified places as the place of occurrence of the external cause: Secondary | ICD-10-CM | POA: Diagnosis not present

## 2017-02-11 DIAGNOSIS — W010XXA Fall on same level from slipping, tripping and stumbling without subsequent striking against object, initial encounter: Secondary | ICD-10-CM | POA: Diagnosis not present

## 2017-02-11 DIAGNOSIS — Z7982 Long term (current) use of aspirin: Secondary | ICD-10-CM | POA: Insufficient documentation

## 2017-02-11 DIAGNOSIS — Y990 Civilian activity done for income or pay: Secondary | ICD-10-CM | POA: Insufficient documentation

## 2017-02-11 DIAGNOSIS — Z79899 Other long term (current) drug therapy: Secondary | ICD-10-CM | POA: Diagnosis not present

## 2017-02-11 DIAGNOSIS — Y9389 Activity, other specified: Secondary | ICD-10-CM | POA: Insufficient documentation

## 2017-02-11 DIAGNOSIS — S82041A Displaced comminuted fracture of right patella, initial encounter for closed fracture: Secondary | ICD-10-CM | POA: Insufficient documentation

## 2017-02-11 DIAGNOSIS — S8991XA Unspecified injury of right lower leg, initial encounter: Secondary | ICD-10-CM | POA: Diagnosis present

## 2017-02-11 MED ORDER — OXYCODONE-ACETAMINOPHEN 5-325 MG PO TABS
1.0000 | ORAL_TABLET | Freq: Once | ORAL | Status: AC
  Administered 2017-02-11: 1 via ORAL
  Filled 2017-02-11: qty 1

## 2017-02-11 MED ORDER — OXYCODONE-ACETAMINOPHEN 5-325 MG PO TABS: 1.0000 | ORAL_TABLET | Freq: Four times a day (QID) | ORAL | 0 refills | Status: DC | PRN

## 2017-02-11 NOTE — ED Provider Notes (Signed)
[facility] Emergency Department Provider Note  ____________________________________________   I have reviewed the triage vital signs and the nursing notes.   HISTORY  Chief Complaint Knee Injury    HPI Shelly Heath is a 76 y.o. female history of osteopenia who presents today after a nonsurgical fall. She slipped on something in the cafeteria at work. She landed on her bent knee right on her patella. She was unable to get up thereafter because she was having trouble moving the leg. EMS transported her with her leg in extension. She has no pain at this time. She states that she has no numbness or weakness. There is no distal location of the knee. They did notice the patella was in the wrong position. The patient has had no closed head injury this is a noncigarette will fall no back pain no hip pain no other injury noted     Past Medical History:  Diagnosis Date  . Allergic rhinitis   . Anemia   . Bell's palsy   . Elevated liver enzymes   . GERD (gastroesophageal reflux disease)   . Hyperlipidemia   . Hypertension   . Leukopenia   . Osteoporosis, post-menopausal   . Pneumonia     There are no active problems to display for this patient.   Past Surgical History:  Procedure Laterality Date  . BROW LIFT Bilateral 10/16/2016   Procedure: BLEPHAROPLASTY;  Surgeon: Imagene RichesAmy M Fowler, MD;  Location[facility];  Service: Ophthalmology;  Laterality: Bilateral;  . CATARACT EXTRACTION W/PHACO Left 04/24/2016   Procedure: CATARACT EXTRACTION PHACO AND INTRAOCULAR LENS PLACEMENT (IOC);  Surgeon: Galen ManilaWilliam Porfilio, MD;  Location: [facility] ORS;  Service: Ophthalmology;  Laterality: Left;  US 50.9 AP% 16.3 CDE 8.32 Fluid Pack Lot # [id number]  . CATARACT EXTRACTION W/PHACO Right 05/15/2016   Procedure: CATARACT EXTRACTION PHACO AND INTRAOCULAR LENS PLACEMENT (IOC);  Surgeon: Galen ManilaWilliam Porfilio, MD;  Location: [facility] ORS;  Service: Ophthalmology;  Laterality: Right;  US  00:43 AP% 18.8 CDE 8.19 fluid pack lot # [id number]  . COLONOSCOPY    . COLONOSCOPY N/A 05/07/2016   Procedure: COLONOSCOPY;  Surgeon: Scot Junobert T Elliott, MD;  [facility];  Service: Endoscopy;  Laterality: N/A;  . NASAL FRACTURE SURGERY    . PTOSIS REPAIR Bilateral 10/16/2016   Procedure: PTOSIS REPAIR;  Surgeon: Imagene RichesAmy M Fowler, MD;  [facility];  Service: Ophthalmology;  Laterality: Bilateral;  . smr    . TUBAL LIGATION      Prior to Admission medications   Medication Sig Start Date End Date Taking? Authorizing Provider  aspirin 81 MG tablet Take 81 mg by mouth daily.    Historical Provider, MD  calcium carbonate (OSCAL) 1500 (600 Ca) MG TABS tablet Take 600 mg by mouth 2 (two) times daily.    Historical Provider, MD  cetirizine (ZYRTEC) 10 MG tablet Take 10 mg by mouth daily as needed.    Historical Provider, MD  erythromycin [facility](ROMYCIN) ophthalmic ointment Use a small amount on your sutures 4 times a day for the next 2 weeks. Switch to Aquaphor ointment should allergy develop. 10/16/16   Amy Tanda RockersM Fowler, MD  famotidine (PEPCID) 10 MG tablet Take 10 mg by mouth as needed for heartburn or indigestion.    Historical Provider, MD  losartan (COZAAR) 25 MG tablet Take 25 mg by mouth daily.    Historical Provider, MD  oxyCODONE-acetaminophen (PERCOCET) 5-325 MG tablet Take 1 tablet by mouth every 4 (four) hours as needed  for severe pain. 10/16/16   Amy Tanda Rockers, MD  simvastatin (ZOCOR) 40 MG tablet Take 40 mg by mouth at bedtime.     Historical Provider, MD    Allergies Alendronate; Fish oil; Other; Raloxifene; and Risedronate  Family History  Problem Relation Age of Onset  . Lung cancer Mother   . Esophageal cancer Father   . Ovarian cancer Sister 72    Social History Social History  Substance Use Topics  . Smoking status: Never Smoker  . Smokeless tobacco: Never Used  . Alcohol use No    Review of Systems Constitutional: No fever/chills Eyes: No visual  changes. ENT: No sore throat. No stiff neck no neck pain Cardiovascular: Denies chest pain. Respiratory: Denies shortness of breath. Gastrointestinal:   no vomiting.  No diarrhea.  No constipation. Genitourinary: Negative for dysuria. Musculoskeletal: Negative lower extremity swelling Skin: Negative for rash. Neurological: Negative for severe headaches, focal weakness or numbness. 10-point ROS otherwise negative.  ____________________________________________   PHYSICAL EXAM:  VITAL SIGNS: ED Triage Vitals [02/11/17 1252]  Enc Vitals Group     BP (!) 218/99     Pulse Rate 79     Resp 16     Temp 98.2 F (36.8 C)     Temp Source Oral     SpO2 98 %     Weight 135 lb (61.2 kg)     Height 5\' 4"  (1.626 m)     Head Circumference      Peak Flow      Pain Score 0     Pain Loc      Pain Edu?      Excl. in GC?     Constitutional: Alert and oriented. Well appearing and in no acute distress. Eyes: Conjunctivae are normal. PERRL. EOMI. Head: Atraumatic. Nose: No congestion/rhinnorhea. Mouth/Throat: Mucous membranes are moist.  Oropharynx non-erythematous. Neck: No stridor.   Nontender with no meningismus Cardiovascular: Normal rate, regular rhythm. Grossly normal heart sounds.  Good peripheral circulation. Respiratory: Normal respiratory effort.  No retractions. Lungs CTAB. Abdominal: Soft and nontender. No distention. No guarding no rebound Back:  There is no focal tenderness or step off.  there is no midline tenderness there are no lesions noted. there is no CVA tenderness Musculoskeletal:There is obvious patella alta with bruising noted to the knee. Compartments are soft. There is no hip or ankle or foot pain. She has strong DP and posterior tibial pulses. The patient is unable to raise her distal leg off the bed. And station is intact. no upper extremity tenderness. No joint effusions, no DVT signs strong distal pulses no edema Neurologic:  Normal speech and language. No gross  focal neurologic deficits are appreciated.  Skin:  Skin is warm, dry and intact. No rash noted. Psychiatric: Mood and affect are normal. Speech and behavior are normal.  ____________________________________________   LABS (all labs ordered are listed, but only abnormal results are displayed)  Labs Reviewed - No data to display ____________________________________________  EKG  I personally interpreted any EKGs ordered by me or triage  ____________________________________________  RADIOLOGY  I reviewed any imaging ordered by me or triage that were performed during my shift and, if possible, patient and/or family made aware of any abnormal findings. ____________________________________________   PROCEDURES  Procedure(s) performed: None  Procedures  Critical Care performed: None  ____________________________________________   INITIAL IMPRESSION / ASSESSMENT AND PLAN / ED COURSE  Pertinent labs & imaging results that were available during my care of the patient  were reviewed by me and considered in my medical decision making (see chart for details).  Patient with a non-syncopal fall presents with what is obviously a patellar injury. X-ray verifies a ruptured patella him. I did discuss with Dr. Martha Clan of orthopedic surgery, who agrees with management and would like the patient have a knee immobilizer and crutches. She is weightbearing as tolerated with a knee immobilizer according to him. He wishes no further intervention in the emergency department. Patient herself is in no acute distress he actually is not suffering from any significant discomfort as long as we are not moving the leg. She declined initial pain medication. Blood pressure was somewhat elevated as one would expect with an ambulance ride, we'll recheck that.    ____________________________________________   FINAL CLINICAL IMPRESSION(S) / ED DIAGNOSES  Final diagnoses:  None      This chart was  dictated using voice recognition software.  Despite best efforts to proofread,  errors can occur which can change meaning.      Jeanmarie Plant, MD 02/11/17 1326

## 2017-02-11 NOTE — ED Notes (Signed)
WC profile placed on chart. No UDS needed.

## 2017-02-11 NOTE — ED Triage Notes (Signed)
Pt here from work after slipping on a lima bean. Pt with right knee deformity, this is worker's comp case.

## 2017-02-11 NOTE — Discharge Instructions (Signed)
Take aspirin every day as already scheduled until you talk to your orthopedic surgery. Keep the knee brace on especially when you walk. Use the crutches. Return to the emergency room for any new or worrisome symptoms including increased pain, swelling, numbness or weakness or you feel worse in any way. The blood pressure was somewhat elevated the emergency room this is likely because of the injury. We ask that you please get that rechecked in the next day or 2.

## 2017-02-18 ENCOUNTER — Other Ambulatory Visit: Payer: Self-pay | Admitting: Orthopedic Surgery

## 2017-02-18 DIAGNOSIS — S82041A Displaced comminuted fracture of right patella, initial encounter for closed fracture: Secondary | ICD-10-CM

## 2017-02-21 ENCOUNTER — Ambulatory Visit
Admission: RE | Admit: 2017-02-21 | Discharge: 2017-02-21 | Disposition: A | Discharge status: Home / Self Care | Payer: Worker's Compensation | Source: Ambulatory Visit | Attending: Orthopedic Surgery | Admitting: Orthopedic Surgery

## 2017-02-21 DIAGNOSIS — S82041A Displaced comminuted fracture of right patella, initial encounter for closed fracture: Secondary | ICD-10-CM | POA: Diagnosis present

## 2017-02-21 DIAGNOSIS — X58XXXA Exposure to other specified factors, initial encounter: Secondary | ICD-10-CM | POA: Diagnosis not present

## 2017-02-22 ENCOUNTER — Other Ambulatory Visit: Payer: Self-pay | Admitting: Orthopedic Surgery

## 2017-02-22 DIAGNOSIS — S82041A Displaced comminuted fracture of right patella, initial encounter for closed fracture: Secondary | ICD-10-CM

## 2017-02-26 ENCOUNTER — Ambulatory Visit
Admission: RE | Admit: 2017-02-26 | Discharge: 2017-02-26 | Disposition: A | Discharge status: Home / Self Care | Payer: Worker's Compensation | Source: Ambulatory Visit | Attending: Orthopedic Surgery | Admitting: Orthopedic Surgery

## 2017-02-26 DIAGNOSIS — Z01812 Encounter for preprocedural laboratory examination: Secondary | ICD-10-CM | POA: Insufficient documentation

## 2017-02-26 DIAGNOSIS — Z01818 Encounter for other preprocedural examination: Secondary | ICD-10-CM | POA: Diagnosis not present

## 2017-02-26 DIAGNOSIS — I10 Essential (primary) hypertension: Secondary | ICD-10-CM | POA: Diagnosis not present

## 2017-02-26 DIAGNOSIS — E785 Hyperlipidemia, unspecified: Secondary | ICD-10-CM | POA: Insufficient documentation

## 2017-02-26 LAB — CBC
HCT: 37.8 % (ref 35.0–47.0)
Hemoglobin: 12.6 g/dL (ref 12.0–16.0)
MCH: 29.9 pg (ref 26.0–34.0)
MCHC: 33.3 g/dL (ref 32.0–36.0)
MCV: 89.8 fL (ref 80.0–100.0)
PLATELETS: 304 10*3/uL (ref 150–440)
RBC: 4.21 MIL/uL (ref 3.80–5.20)
RDW: 14.1 % (ref 11.5–14.5)
WBC: 7 10*3/uL (ref 3.6–11.0)

## 2017-02-26 NOTE — Patient Instructions (Signed)
Your procedure is scheduled on: Thursday 02/28/17 Report to DAY SURGERY. 2ND FLOOR MEDICAL MALL ENTRANCE. To find out your arrival time please call [phone] between 1PM - 3PM on Wednesday 02/27/17.  Remember: Instructions that are not followed completely may result in serious medical risk, up to and including death, or upon the discretion of your surgeon and anesthesiologist your surgery may need to be rescheduled.    __X__ 1. Do not eat food or drink liquids after midnight. No gum chewing or hard candies.     __X__ 2. No Alcohol for 24 hours before or after surgery.   ____ 3. Bring all medications with you on the day of surgery if instructed.    __X__ 4. Notify your doctor if there is any change in your medical condition     (cold, fever, infections).             __x___5. No smoking within 24 hours of your surgery.     Do not wear jewelry, make-up, hairpins, clips or nail polish.  Do not wear lotions, powders, or perfumes.   Do not shave 48 hours prior to surgery. Men may shave face and neck.  Do not bring valuables to the hospital.    [facility] is not responsible for any belongings or valuables.               Contacts, dentures or bridgework may not be worn into surgery.  Leave your suitcase in the car. After surgery it may be brought to your room.  For patients admitted to the hospital, discharge time is determined by your                treatment team.   Patients discharged the day of surgery will not be allowed to drive home.   Please read over the following fact sheets that you were given:   Pain Booklet and MRSA Information   __x__ Take these medicines the morning of surgery with A SIP OF WATER:    1. losartan  2. pepcid  3. Zyrtec if needed  4. Oxycodone if needed  5.  6.  ____ Fleet Enema (as directed)   __x__ Use CHG Soap as directed  ____ Use inhalers on the day of surgery  ____ Stop metformin 2 days prior to surgery    ____ Take 1/2 of usual insulin  dose the night before surgery and none on the morning of surgery.   __x__ Stop Coumadin/Plavix/aspirin on already stopped aspirin  __X__ Stop Anti-inflammatories such as Advil, Aleve, Ibuprofen, Motrin, Naproxen, Naprosyn, Goodies,powder, or aspirin products.  OK to take Tylenol.   ____ Stop supplements until after surgery.    ____ Bring C-Pap to the hospital.

## 2017-02-27 ENCOUNTER — Other Ambulatory Visit: Payer: Self-pay | Admitting: Orthopedic Surgery

## 2017-02-28 ENCOUNTER — Ambulatory Visit: Payer: Worker's Compensation

## 2017-02-28 ENCOUNTER — Inpatient Hospital Stay: Payer: Worker's Compensation

## 2017-02-28 ENCOUNTER — Ambulatory Visit: Payer: Worker's Compensation | Admitting: Anesthesiology

## 2017-02-28 ENCOUNTER — Encounter
Admission: AD | Disposition: A | Discharge status: Home Health | Payer: Self-pay | Source: Ambulatory Visit | Attending: Orthopedic Surgery

## 2017-02-28 ENCOUNTER — Inpatient Hospital Stay
Admission: AD | Admit: 2017-02-28 | Discharge: 2017-03-01 | DRG: 516 | Disposition: A | Discharge status: Home Health | Payer: Worker's Compensation | Source: Ambulatory Visit | Attending: Orthopedic Surgery | Admitting: Orthopedic Surgery

## 2017-02-28 DIAGNOSIS — M2500 Hemarthrosis, unspecified joint: Secondary | ICD-10-CM | POA: Diagnosis present

## 2017-02-28 DIAGNOSIS — Z79899 Other long term (current) drug therapy: Secondary | ICD-10-CM | POA: Diagnosis not present

## 2017-02-28 DIAGNOSIS — M810 Age-related osteoporosis without current pathological fracture: Secondary | ICD-10-CM | POA: Diagnosis present

## 2017-02-28 DIAGNOSIS — K219 Gastro-esophageal reflux disease without esophagitis: Secondary | ICD-10-CM | POA: Diagnosis present

## 2017-02-28 DIAGNOSIS — I10 Essential (primary) hypertension: Secondary | ICD-10-CM | POA: Diagnosis present

## 2017-02-28 DIAGNOSIS — Z7982 Long term (current) use of aspirin: Secondary | ICD-10-CM

## 2017-02-28 DIAGNOSIS — W19XXXA Unspecified fall, initial encounter: Secondary | ICD-10-CM | POA: Diagnosis present

## 2017-02-28 DIAGNOSIS — Z419 Encounter for procedure for purposes other than remedying health state, unspecified: Secondary | ICD-10-CM

## 2017-02-28 DIAGNOSIS — S82041A Displaced comminuted fracture of right patella, initial encounter for closed fracture: Secondary | ICD-10-CM | POA: Diagnosis present

## 2017-02-28 DIAGNOSIS — E785 Hyperlipidemia, unspecified: Secondary | ICD-10-CM | POA: Diagnosis present

## 2017-02-28 DIAGNOSIS — D649 Anemia, unspecified: Secondary | ICD-10-CM | POA: Diagnosis present

## 2017-02-28 DIAGNOSIS — S82001A Unspecified fracture of right patella, initial encounter for closed fracture: Secondary | ICD-10-CM

## 2017-02-28 DIAGNOSIS — S82033A Displaced transverse fracture of unspecified patella, initial encounter for closed fracture: Secondary | ICD-10-CM | POA: Diagnosis present

## 2017-02-28 HISTORY — PX: ORIF PATELLA: SHX5033

## 2017-02-28 LAB — CBC WITH DIFFERENTIAL/PLATELET
Basophils Absolute: 0 10*3/uL (ref 0–0.1)
Basophils Relative: 1 %
EOS ABS: 0 10*3/uL (ref 0–0.7)
EOS PCT: 1 %
HCT: 37.9 % (ref 35.0–47.0)
Hemoglobin: 12.5 g/dL (ref 12.0–16.0)
LYMPHS PCT: 17 %
Lymphs Abs: 1.1 10*3/uL (ref 1.0–3.6)
MCH: 29.2 pg (ref 26.0–34.0)
MCHC: 33.1 g/dL (ref 32.0–36.0)
MCV: 88.3 fL (ref 80.0–100.0)
MONOS PCT: 9 %
Monocytes Absolute: 0.6 10*3/uL (ref 0.2–0.9)
Neutro Abs: 4.9 10*3/uL (ref 1.4–6.5)
Neutrophils Relative %: 72 %
PLATELETS: 300 10*3/uL (ref 150–440)
RBC: 4.3 MIL/uL (ref 3.80–5.20)
RDW: 14.3 % (ref 11.5–14.5)
WBC: 6.7 10*3/uL (ref 3.6–11.0)

## 2017-02-28 LAB — PROTIME-INR
INR: 0.98
PROTHROMBIN TIME: 13 s (ref 11.4–15.2)

## 2017-02-28 LAB — BASIC METABOLIC PANEL
Anion gap: 5 (ref 5–15)
BUN: 15 mg/dL (ref 6–20)
CHLORIDE: 106 mmol/L (ref 101–111)
CO2: 30 mmol/L (ref 22–32)
CREATININE: 0.77 mg/dL (ref 0.44–1.00)
Calcium: 9.3 mg/dL (ref 8.9–10.3)
GFR calc Af Amer: >60 mL/min (ref >60)
GFR calc non Af Amer: >60 mL/min (ref >60)
GLUCOSE: 94 mg/dL (ref 65–99)
POTASSIUM: 4.3 mmol/L (ref 3.5–5.1)
Sodium: 141 mmol/L (ref 135–145)

## 2017-02-28 LAB — APTT: aPTT: 30 seconds (ref 24–36)

## 2017-02-28 SURGERY — OPEN REDUCTION INTERNAL FIXATION (ORIF) PATELLA
Anesthesia: Spinal | Site: Knee | Laterality: Right | Wound class: Clean

## 2017-02-28 MED ORDER — ONDANSETRON HCL 4 MG PO TABS: 4.0000 mg | ORAL_TABLET | Freq: Four times a day (QID) | ORAL | Status: DC | PRN

## 2017-02-28 MED ORDER — BUPIVACAINE HCL (PF) 0.5 % IJ SOLN
INTRAMUSCULAR | Status: AC
  Filled 2017-02-28: qty 30

## 2017-02-28 MED ORDER — ALBUTEROL SULFATE HFA 108 (90 BASE) MCG/ACT IN AERS
INHALATION_SPRAY | RESPIRATORY_TRACT | Status: AC
  Filled 2017-02-28: qty 6.7

## 2017-02-28 MED ORDER — KETOROLAC TROMETHAMINE 15 MG/ML IJ SOLN
15.0000 mg | Freq: Four times a day (QID) | INTRAMUSCULAR | Status: DC
  Administered 2017-02-28 – 2017-03-01 (×4): 15 mg via INTRAVENOUS
  Filled 2017-02-28 (×4): qty 1

## 2017-02-28 MED ORDER — TETRACAINE HCL 1 % IJ SOLN
INTRAMUSCULAR | Status: DC | PRN
  Administered 2017-02-28: 2 mg via INTRASPINAL

## 2017-02-28 MED ORDER — ACETAMINOPHEN 10 MG/ML IV SOLN
INTRAVENOUS | Status: AC
  Filled 2017-02-28: qty 100

## 2017-02-28 MED ORDER — SIMVASTATIN 20 MG PO TABS
40.0000 mg | ORAL_TABLET | Freq: Every day | ORAL | Status: DC
  Administered 2017-02-28: 40 mg via ORAL
  Filled 2017-02-28: qty 2

## 2017-02-28 MED ORDER — CHLORHEXIDINE GLUCONATE CLOTH 2 % EX PADS: 6.0000 | MEDICATED_PAD | Freq: Once | CUTANEOUS | Status: DC

## 2017-02-28 MED ORDER — CEFAZOLIN SODIUM-DEXTROSE 2-4 GM/100ML-% IV SOLN
INTRAVENOUS | Status: AC
  Filled 2017-02-28: qty 100

## 2017-02-28 MED ORDER — PROPOFOL 10 MG/ML IV BOLUS
INTRAVENOUS | Status: AC
  Filled 2017-02-28: qty 20

## 2017-02-28 MED ORDER — MIDAZOLAM HCL 2 MG/2ML IJ SOLN
INTRAMUSCULAR | Status: DC | PRN
  Administered 2017-02-28: 1 mg via INTRAVENOUS
  Administered 2017-02-28 (×2): 0.5 mg via INTRAVENOUS

## 2017-02-28 MED ORDER — NEOMYCIN-POLYMYXIN B GU 40-200000 IR SOLN
Status: DC | PRN
Start: 2017-02-28 — End: 2017-02-28
  Administered 2017-02-28: 2 mL

## 2017-02-28 MED ORDER — LOSARTAN POTASSIUM 50 MG PO TABS
25.0000 mg | ORAL_TABLET | Freq: Every day | ORAL | Status: DC
  Administered 2017-03-01: 25 mg via ORAL
  Filled 2017-02-28: qty 1

## 2017-02-28 MED ORDER — BUPIVACAINE HCL (PF) 0.5 % IJ SOLN: INTRAMUSCULAR | Status: DC | PRN

## 2017-02-28 MED ORDER — FENTANYL CITRATE (PF) 100 MCG/2ML IJ SOLN: 25.0000 ug | INTRAMUSCULAR | Status: DC | PRN

## 2017-02-28 MED ORDER — ACETAMINOPHEN 10 MG/ML IV SOLN
INTRAVENOUS | Status: DC | PRN
Start: 2017-02-28 — End: 2017-02-28
  Administered 2017-02-28: 1000 mg via INTRAVENOUS

## 2017-02-28 MED ORDER — EPHEDRINE SULFATE 50 MG/ML IJ SOLN
INTRAMUSCULAR | Status: DC | PRN
  Administered 2017-02-28: 10 mg via INTRAVENOUS
  Administered 2017-02-28 (×2): 5 mg via INTRAVENOUS

## 2017-02-28 MED ORDER — SENNA 8.6 MG PO TABS
1.0000 | ORAL_TABLET | Freq: Two times a day (BID) | ORAL | Status: DC
Start: 2017-02-28 — End: 2017-03-01
  Administered 2017-02-28 – 2017-03-01 (×2): 8.6 mg via ORAL
  Filled 2017-02-28 (×2): qty 1

## 2017-02-28 MED ORDER — MIDAZOLAM HCL 2 MG/2ML IJ SOLN
INTRAMUSCULAR | Status: AC
  Filled 2017-02-28: qty 2

## 2017-02-28 MED ORDER — SODIUM CHLORIDE 0.9 % IV SOLN
75.0000 mL/h | INTRAVENOUS | Status: DC
  Administered 2017-02-28 – 2017-03-01 (×2): 75 mL/h via INTRAVENOUS

## 2017-02-28 MED ORDER — NEOMYCIN-POLYMYXIN B GU 40-200000 IR SOLN
Status: AC
  Filled 2017-02-28: qty 2

## 2017-02-28 MED ORDER — CEFAZOLIN SODIUM-DEXTROSE 2-4 GM/100ML-% IV SOLN: 2.0000 g | Freq: Four times a day (QID) | INTRAVENOUS | Status: DC

## 2017-02-28 MED ORDER — ACETAMINOPHEN 325 MG PO TABS: 650.0000 mg | ORAL_TABLET | Freq: Four times a day (QID) | ORAL | Status: DC | PRN

## 2017-02-28 MED ORDER — FENTANYL CITRATE (PF) 100 MCG/2ML IJ SOLN
INTRAMUSCULAR | Status: DC | PRN
  Administered 2017-02-28: 50 ug via INTRAVENOUS

## 2017-02-28 MED ORDER — POLYETHYLENE GLYCOL 3350 17 G PO PACK: 17.0000 g | PACK | Freq: Every day | ORAL | Status: DC | PRN

## 2017-02-28 MED ORDER — TETRACAINE HCL 1 % IJ SOLN: INTRAMUSCULAR | Status: DC | PRN

## 2017-02-28 MED ORDER — ASPIRIN EC 325 MG PO TBEC
325.0000 mg | DELAYED_RELEASE_TABLET | Freq: Two times a day (BID) | ORAL | Status: DC
  Administered 2017-02-28 – 2017-03-01 (×2): 325 mg via ORAL
  Filled 2017-02-28 (×2): qty 1

## 2017-02-28 MED ORDER — FENTANYL CITRATE (PF) 100 MCG/2ML IJ SOLN
INTRAMUSCULAR | Status: AC
  Filled 2017-02-28: qty 2

## 2017-02-28 MED ORDER — BUPIVACAINE HCL (PF) 0.5 % IJ SOLN
INTRAMUSCULAR | Status: DC | PRN
Start: 2017-02-28 — End: 2017-02-28
  Administered 2017-02-28: 30 mL

## 2017-02-28 MED ORDER — FAMOTIDINE 20 MG PO TABS: 10.0000 mg | ORAL_TABLET | ORAL | Status: DC | PRN

## 2017-02-28 MED ORDER — FERROUS SULFATE 325 (65 FE) MG PO TABS
325.0000 mg | ORAL_TABLET | Freq: Three times a day (TID) | ORAL | Status: DC
  Administered 2017-03-01: 325 mg via ORAL
  Filled 2017-02-28: qty 1

## 2017-02-28 MED ORDER — FAMOTIDINE 20 MG PO TABS
ORAL_TABLET | ORAL | Status: AC
  Filled 2017-02-28: qty 1

## 2017-02-28 MED ORDER — PROPOFOL 500 MG/50ML IV EMUL
INTRAVENOUS | Status: DC | PRN
  Administered 2017-02-28: 50 ug/kg/min via INTRAVENOUS

## 2017-02-28 MED ORDER — LORATADINE 10 MG PO TABS
10.0000 mg | ORAL_TABLET | Freq: Every day | ORAL | Status: DC
  Administered 2017-03-01: 10 mg via ORAL
  Filled 2017-02-28: qty 1

## 2017-02-28 MED ORDER — CEFAZOLIN SODIUM-DEXTROSE 2-4 GM/100ML-% IV SOLN
2.0000 g | INTRAVENOUS | Status: AC
  Administered 2017-02-28: 2 g via INTRAVENOUS

## 2017-02-28 MED ORDER — METHOCARBAMOL 1000 MG/10ML IJ SOLN
500.0000 mg | Freq: Four times a day (QID) | INTRAVENOUS | Status: DC | PRN
  Filled 2017-02-28: qty 5

## 2017-02-28 MED ORDER — GLYCOPYRROLATE 0.2 MG/ML IJ SOLN
INTRAMUSCULAR | Status: DC | PRN
  Administered 2017-02-28: 0.1 mg via INTRAVENOUS

## 2017-02-28 MED ORDER — MAGNESIUM CITRATE PO SOLN
1.0000 | Freq: Once | ORAL | Status: DC | PRN
  Filled 2017-02-28: qty 296

## 2017-02-28 MED ORDER — SEVOFLURANE IN SOLN
RESPIRATORY_TRACT | Status: AC
  Filled 2017-02-28: qty 250

## 2017-02-28 MED ORDER — ACETAMINOPHEN 650 MG RE SUPP: 650.0000 mg | Freq: Four times a day (QID) | RECTAL | Status: DC | PRN

## 2017-02-28 MED ORDER — LACTATED RINGERS IV SOLN
INTRAVENOUS | Status: DC
  Administered 2017-02-28: 1000 mL via INTRAVENOUS
  Administered 2017-02-28: 12:00:00 via INTRAVENOUS

## 2017-02-28 MED ORDER — LIDOCAINE HCL (CARDIAC) 20 MG/ML IV SOLN
INTRAVENOUS | Status: DC | PRN
  Administered 2017-02-28: 20 mg via INTRAVENOUS

## 2017-02-28 MED ORDER — MENTHOL 3 MG MT LOZG
1.0000 | LOZENGE | OROMUCOSAL | Status: DC | PRN
Start: 2017-02-28 — End: 2017-03-01
  Filled 2017-02-28: qty 9

## 2017-02-28 MED ORDER — DEXTROSE 5 % IV SOLN
2.0000 g | Freq: Four times a day (QID) | INTRAVENOUS | Status: AC
  Administered 2017-02-28 (×2): 2 g via INTRAVENOUS
  Filled 2017-02-28 (×2): qty 2000

## 2017-02-28 MED ORDER — METHOCARBAMOL 500 MG PO TABS
500.0000 mg | ORAL_TABLET | Freq: Four times a day (QID) | ORAL | Status: DC | PRN
  Administered 2017-02-28 – 2017-03-01 (×2): 500 mg via ORAL
  Filled 2017-02-28 (×2): qty 1

## 2017-02-28 MED ORDER — MORPHINE SULFATE (PF) 2 MG/ML IV SOLN
2.0000 mg | INTRAVENOUS | Status: DC | PRN
  Administered 2017-03-01: 2 mg via INTRAVENOUS
  Filled 2017-02-28 (×2): qty 1

## 2017-02-28 MED ORDER — GLYCOPYRROLATE 0.2 MG/ML IJ SOLN
INTRAMUSCULAR | Status: AC
  Filled 2017-02-28: qty 1

## 2017-02-28 MED ORDER — CEFAZOLIN SODIUM-DEXTROSE 2-3 GM-% IV SOLR
2.0000 g | Freq: Four times a day (QID) | INTRAVENOUS | Status: DC
  Filled 2017-02-28 (×2): qty 50

## 2017-02-28 MED ORDER — BUPIVACAINE HCL (PF) 0.5 % IJ SOLN
INTRAMUSCULAR | Status: DC | PRN
  Administered 2017-02-28: 2.5 mL

## 2017-02-28 MED ORDER — CALCIUM CARBONATE ANTACID 500 MG PO CHEW: 600.0000 mg | CHEWABLE_TABLET | Freq: Every day | ORAL | Status: DC

## 2017-02-28 MED ORDER — PHENOL 1.4 % MT LIQD
1.0000 | OROMUCOSAL | Status: DC | PRN
Start: 2017-02-28 — End: 2017-03-01
  Filled 2017-02-28: qty 177

## 2017-02-28 MED ORDER — OXYCODONE HCL 5 MG PO TABS
5.0000 mg | ORAL_TABLET | ORAL | Status: DC | PRN
  Administered 2017-02-28: 10 mg via ORAL
  Administered 2017-02-28 (×2): 5 mg via ORAL
  Administered 2017-02-28 – 2017-03-01 (×3): 10 mg via ORAL
  Administered 2017-03-01: 5 mg via ORAL
  Filled 2017-02-28: qty 1
  Filled 2017-02-28: qty 2
  Filled 2017-02-28: qty 1
  Filled 2017-02-28 (×3): qty 2
  Filled 2017-02-28: qty 1

## 2017-02-28 MED ORDER — BISACODYL 10 MG RE SUPP
10.0000 mg | Freq: Every day | RECTAL | Status: DC | PRN
Start: 2017-02-28 — End: 2017-03-01

## 2017-02-28 MED ORDER — LIDOCAINE HCL (PF) 2 % IJ SOLN
INTRAMUSCULAR | Status: AC
  Filled 2017-02-28: qty 2

## 2017-02-28 MED ORDER — ONDANSETRON HCL 4 MG/2ML IJ SOLN: 4.0000 mg | Freq: Once | INTRAMUSCULAR | Status: DC | PRN

## 2017-02-28 MED ORDER — ONDANSETRON HCL 4 MG/2ML IJ SOLN: 4.0000 mg | Freq: Four times a day (QID) | INTRAMUSCULAR | Status: DC | PRN

## 2017-02-28 MED ORDER — DOCUSATE SODIUM 100 MG PO CAPS
100.0000 mg | ORAL_CAPSULE | Freq: Two times a day (BID) | ORAL | Status: DC
Start: 2017-02-28 — End: 2017-03-01
  Administered 2017-02-28 – 2017-03-01 (×2): 100 mg via ORAL
  Filled 2017-02-28 (×2): qty 1

## 2017-02-28 SURGICAL SUPPLY — 69 items
BANDAGE ELASTIC 6 LF NS (GAUZE/BANDAGES/DRESSINGS) ×2 IMPLANT
BIT DRILL 12.7X2STRG SHNK (BIT) ×1 IMPLANT
BIT DRILL 2.0 (BIT) ×1
BIT DRL 12.7X2STRG SHNK (BIT) ×1
BLADE SURG SZ10 CARB STEEL (BLADE) ×2 IMPLANT
BNDG COHESIVE 4X5 TAN STRL (GAUZE/BANDAGES/DRESSINGS) ×2 IMPLANT
BNDG ESMARK 6X12 TAN STRL LF (GAUZE/BANDAGES/DRESSINGS) ×2 IMPLANT
BRACE KNEE POST OP SHORT (BRACE) IMPLANT
CABLE PIN IMPLANT 35 (Cable) ×2 IMPLANT
CABLE PIN IMPLANT 45 (Cable) ×2 IMPLANT
CANISTER SUCT 1200ML W/VALVE (MISCELLANEOUS) ×2 IMPLANT
COOLER POLAR GLACIER W/PUMP (MISCELLANEOUS) ×2 IMPLANT
CUFF TOURN 24 STER (MISCELLANEOUS) ×2 IMPLANT
CUFF TOURN 30 STER DUAL PORT (MISCELLANEOUS) IMPLANT
DECANTER SPIKE VIAL GLASS SM (MISCELLANEOUS) ×2 IMPLANT
DRAPE C-ARM XRAY 36X54 (DRAPES) ×2 IMPLANT
DRAPE C-ARMOR (DRAPES) ×2 IMPLANT
DRAPE INCISE IOBAN 66X45 STRL (DRAPES) ×2 IMPLANT
DRAPE U-SHAPE 47X51 STRL (DRAPES) ×2 IMPLANT
DRSG OPSITE POSTOP 4X8 (GAUZE/BANDAGES/DRESSINGS) ×2 IMPLANT
DURAPREP 26ML APPLICATOR (WOUND CARE) ×6 IMPLANT
ELECT REM PT RETURN 9FT ADLT (ELECTROSURGICAL) ×2
ELECTRODE REM PT RTRN 9FT ADLT (ELECTROSURGICAL) ×1 IMPLANT
GAUZE PETRO XEROFOAM 1X8 (MISCELLANEOUS) ×2 IMPLANT
GAUZE SPONGE 4X4 12PLY STRL (GAUZE/BANDAGES/DRESSINGS) IMPLANT
GLOVE BIOGEL PI IND STRL 7.0 (GLOVE) ×3 IMPLANT
GLOVE BIOGEL PI IND STRL 9 (GLOVE) ×1 IMPLANT
GLOVE BIOGEL PI INDICATOR 7.0 (GLOVE) ×3
GLOVE BIOGEL PI INDICATOR 9 (GLOVE) ×1
GLOVE SURG 9.0 ORTHO LTXF (GLOVE) ×6 IMPLANT
GLOVE SURG SYN 7.0 (GLOVE) ×6 IMPLANT
GOWN STRL REUS TWL 2XL XL LVL4 (GOWN DISPOSABLE) ×2 IMPLANT
GOWN STRL REUS W/ TWL LRG LVL3 (GOWN DISPOSABLE) ×3 IMPLANT
GOWN STRL REUS W/TWL LRG LVL3 (GOWN DISPOSABLE) ×3
HANDLE YANKAUER SUCT BULB TIP (MISCELLANEOUS) ×2 IMPLANT
HOLDER FOLEY CATH W/STRAP (MISCELLANEOUS) ×2 IMPLANT
IMMOB KNEE 14 THIGH 24 706614 (SOFTGOODS) IMPLANT
IV CATH ANGIO 14GX1.88 NO SAFE (IV SOLUTION) IMPLANT
K-WIRE 1.6 (WIRE) ×1
K-WIRE FX5X1.6XNS BN SS (WIRE) ×1
KIT RM TURNOVER STRD PROC AR (KITS) ×2 IMPLANT
KWIRE FX5X1.6XNS BN SS (WIRE) ×1 IMPLANT
NDL SAFETY 18GX1.5 (NEEDLE) ×2 IMPLANT
NEEDLE FILTER BLUNT 18X 1/2SAF (NEEDLE) ×1
NEEDLE FILTER BLUNT 18X1 1/2 (NEEDLE) ×1 IMPLANT
NS IRRIG 500ML POUR BTL (IV SOLUTION) ×2 IMPLANT
PACK EXTREMITY ARMC (MISCELLANEOUS) ×2 IMPLANT
PAD ABD DERMACEA PRESS 5X9 (GAUZE/BANDAGES/DRESSINGS) ×8 IMPLANT
PAD PREP 24X41 OB/GYN DISP (PERSONAL CARE ITEMS) ×2 IMPLANT
PAD WRAPON POLAR KNEE (MISCELLANEOUS) ×1 IMPLANT
PADDING CAST 4IN STRL (MISCELLANEOUS) ×2
PADDING CAST BLEND 4X4 STRL (MISCELLANEOUS) ×2 IMPLANT
RETRIEVER SUT HEWSON (MISCELLANEOUS) IMPLANT
SPONGE LAP 18X18 5 PK (GAUZE/BANDAGES/DRESSINGS) ×2 IMPLANT
STAPLER SKIN PROX 35W (STAPLE) ×2 IMPLANT
STOCKINETTE BIAS CUT 6 980064 (GAUZE/BANDAGES/DRESSINGS) ×2 IMPLANT
STOCKINETTE IMPERVIOUS 9X36 MD (GAUZE/BANDAGES/DRESSINGS) ×2 IMPLANT
SUT FIBERWIRE #5 38 BLUE (WIRE) ×2 IMPLANT
SUT FIBERWIRE #5 38 CONV BLUE (SUTURE) ×2
SUT STEEL 7 (SUTURE) IMPLANT
SUT TICRON 2-0 30IN 311381 (SUTURE) ×4 IMPLANT
SUT VIC AB 0 CT1 36 (SUTURE) ×2 IMPLANT
SUT VIC AB 2-0 CT1 (SUTURE) ×2 IMPLANT
SUT VIC AB 2-0 CT1 36 (SUTURE) ×2 IMPLANT
SUTURE FIBERWR #5 38 CONV BLUE (SUTURE) ×1 IMPLANT
SYR 3ML LL SCALE MARK (SYRINGE) ×2 IMPLANT
SYRINGE 10CC LL (SYRINGE) ×2 IMPLANT
TRAY FOLEY W/METER SILVER 16FR (SET/KITS/TRAYS/PACK) ×2 IMPLANT
WRAPON POLAR PAD KNEE (MISCELLANEOUS) ×2

## 2017-02-28 NOTE — H&P (Signed)
PREOPERATIVE H&P  Chief Complaint: Z61.096E Displaced comminuted fracture of right patella, init  HPI: Shelly Heath is a 76 y.o. female who presents with a diagnosis of a displaced, comminuted fracture of right patella. Patient sustained the injury during a fall at work. Patient has been unable to actively extend her knee. Plain films and a CT scan again performed showing significant displacement and comminution of the fracture. This is significantly impairing activities of daily living.  I recommended surgical fixation for this fracture given the degree of displacement and disability the patient is experiencing.  Past Medical History:  Diagnosis Date  . Allergic rhinitis   . Anemia   . Bell's palsy   . Elevated liver enzymes   . GERD (gastroesophageal reflux disease)   . Hyperlipidemia   . Hypertension   . Leukopenia   . Osteoporosis, post-menopausal   . Pneumonia    Past Surgical History:  Procedure Laterality Date  . BROW LIFT Bilateral 10/16/2016   Procedure: BLEPHAROPLASTY;  Surgeon: Imagene Riches, MD;  Location: [facility];  Service: Ophthalmology;  Laterality: Bilateral;  . CATARACT EXTRACTION W/PHACO Left 04/24/2016   Procedure: CATARACT EXTRACTION PHACO AND INTRAOCULAR LENS PLACEMENT (IOC);  Surgeon: Galen Manila, MD;  Location: [facility] ORS;  Service: Ophthalmology;  Laterality: Left;  [place] 50.9 AP% 16.3 CDE 8.32 Fluid Pack Lot # [id number]  . CATARACT EXTRACTION W/PHACO Right 05/15/2016   Procedure: CATARACT EXTRACTION PHACO AND INTRAOCULAR LENS PLACEMENT (IOC);  Surgeon: Galen Manila, MD;  Location: [facility] ORS;  Service: Ophthalmology;  Laterality: Right;  [place] 00:43 AP% 18.8 CDE 8.19 fluid pack lot # [id number]  . COLONOSCOPY    . COLONOSCOPY N/A 05/07/2016   Procedure: COLONOSCOPY;  Surgeon: Scot Jun, MD;  Location: [facility];  Service: Endoscopy;  Laterality: N/A;  . NASAL FRACTURE SURGERY    . PTOSIS REPAIR Bilateral 10/16/2016   Procedure: PTOSIS  REPAIR;  Surgeon: Imagene Riches, MD;  Location: [facility];  Service: Ophthalmology;  Laterality: Bilateral;  . smr    . TONSILLECTOMY    . TUBAL LIGATION     Social History   Social History  . Marital status: Married    Spouse name: N/A  . Number of children: N/A  . Years of education: N/A   Social History Main Topics  . Smoking status: Never Smoker  . Smokeless tobacco: Never Used  . Alcohol use No  . Drug use: Unknown  . Sexual activity: Not Asked   Other Topics Concern  . None   Social History Narrative  . None   Family History  Problem Relation Age of Onset  . Lung cancer Mother   . Esophageal cancer Father   . Ovarian cancer Sister 47   Allergies  Allergen Reactions  . Alendronate Other (See Comments)    dyspepsia  . Fish Oil Other (See Comments)    dyspepsia  . Other Nausea And Vomiting    liquid medications(Colonoscopy prep)  . Raloxifene Other (See Comments)    Elevated transaminases  . Risedronate Other (See Comments)    dyspepsia   Prior to Admission medications   Medication Sig Start Date End Date Taking? Authorizing Provider  aspirin 81 MG tablet Take 81 mg by mouth daily.   Yes Historical Provider, MD  calcium carbonate (OSCAL) 1500 (600 Ca) MG TABS tablet Take 600 mg by mouth daily with breakfast.    Yes Historical Provider, MD  cetirizine (ZYRTEC) 10 MG tablet Take 10 mg by mouth daily  as needed.   Yes Historical Provider, MD  famotidine (PEPCID) 10 MG tablet Take 10 mg by mouth as needed for heartburn or indigestion.   Yes Historical Provider, MD  losartan (COZAAR) 25 MG tablet Take 25 mg by mouth daily.   Yes Historical Provider, MD  oxyCODONE-acetaminophen (ROXICET) 5-325 MG tablet Take 1 tablet by mouth every 6 (six) hours as needed. 02/11/17 02/11/18 Yes Jeanmarie Plant, MD  simvastatin (ZOCOR) 40 MG tablet Take 40 mg by mouth at bedtime.    Yes Historical Provider, MD     Positive ROS: All other systems have been reviewed and were  otherwise negative with the exception of those mentioned in the HPI and as above.  Physical Exam: General: Alert, no acute distress Cardiovascular: Regular rate and rhythm, no murmurs rubs or gallops.  No pedal edema Respiratory: Clear to auscultation bilaterally, no wheezes rales or rhonchi. No cyanosis, no use of accessory musculature GI: No organomegaly, abdomen is soft and non-tender nondistended with positive bowel sounds. Skin: Skin intact, no lesions within the operative field. Neurologic: Sensation intact distally Psychiatric: Patient is competent for consent with normal mood and affect Lymphatic: No cervical lymphadenopathy  MUSCULOSKELETAL: Right knee: Patient's skin is intact. There is no significant swelling. Patient is draining ecchymosis or lower leg without significant swelling. There is no calf tenderness. Patient will pedal pulses, intact sensation light touch throughout the right lower extremity and intact motor function distally.  Assessment: S82.041A Displaced comminuted fracture of right patella, init  Plan: Plan for Procedure(s): OPEN REDUCTION INTERNAL (ORIF) FIXATION RIGHT PATELLA  Patient was seen in the preoperative area with her husband at the bedside. I discussed with him the details of the operation as well as the postoperative course. She'll be admitted postoperatively for pain control.    I discussed the risks and benefits of surgery with the patient and her husband. They understand the risks include but are not limited to infection, bleeding requiring blood transfusion, nerve or blood vessel injury, joint stiffness or loss of motion, persistent pain, weakness or instability, malunion, nonunion and hardware failure and the need for further surgery. Medical risks include but are not limited to DVT and pulmonary embolism, myocardial infarction, stroke, pneumonia, respiratory failure and death. Patient understood these risks and wished to proceed.    Juanell Fairly, MD   02/28/2017 11:35 AM

## 2017-02-28 NOTE — Transfer of Care (Signed)
Immediate Anesthesia Transfer of Care Note  Patient: Shelly Heath  Procedure(s) Performed: Procedure(s): OPEN REDUCTION INTERNAL (ORIF) FIXATION PATELLA (Right)  Patient Location: PACU  Anesthesia Type:Spinal  Level of Consciousness: awake and patient cooperative  Airway & Oxygen Therapy: Patient Spontanous Breathing  Post-op Assessment: Report given to RN and Post -op Vital signs reviewed and stable  Post vital signs: Reviewed and stable  Last Vitals:  Vitals:   02/28/17 1043  BP: (!) 178/55  Pulse: 61  Resp: 18  Temp: 36.6 C    Last Pain:  Vitals:   02/28/17 1043  TempSrc: Oral  PainSc: 1          Complications: No apparent anesthesia complications

## 2017-02-28 NOTE — Anesthesia Preprocedure Evaluation (Addendum)
Anesthesia Evaluation  Patient identified by MRN, date of birth, ID band Patient awake    Reviewed: Allergy & Precautions, H&P , NPO status , Patient's Chart, lab work & pertinent test results  Airway Mallampati: II  TM Distance: >3 FB Neck ROM: full    Dental no notable dental hx.    Pulmonary pneumonia, resolved,    Pulmonary exam normal        Cardiovascular hypertension, Pt. on medications Normal cardiovascular exam     Neuro/Psych Hx of Bell's palsy  Neuromuscular disease negative psych ROS   GI/Hepatic GERD  Medicated,Elevated liver enzymes   Endo/Other  negative endocrine ROS  Renal/GU negative Renal ROS  negative genitourinary   Musculoskeletal   Abdominal   Peds negative pediatric ROS (+)  Hematology  (+) anemia ,   Anesthesia Other Findings Past Medical History: No date: Allergic rhinitis No date: Anemia No date: Bell's palsy No date: Elevated liver enzymes No date: GERD (gastroesophageal reflux disease) No date: Hyperlipidemia No date: Hypertension No date: Leukopenia No date: Osteoporosis, post-menopausal No date: Pneumonia  Reproductive/Obstetrics                             Anesthesia Physical  Anesthesia Plan  ASA: III  Anesthesia Plan: Spinal   Post-op Pain Management:    Induction: Intravenous  Airway Management Planned: Nasal Cannula  Additional Equipment:   Intra-op Plan:   Post-operative Plan:   Informed Consent: I have reviewed the patients History and Physical, chart, labs and discussed the procedure including the risks, benefits and alternatives for the proposed anesthesia with the patient or authorized representative who has indicated his/her understanding and acceptance.     Plan Discussed with:   Anesthesia Plan Comments:       Anesthesia Quick Evaluation

## 2017-02-28 NOTE — Anesthesia Procedure Notes (Signed)
Spinal  Start time: 02/28/2017 12:17 PM End time: 02/28/2017 12:22 PM Staffing Anesthesiologist: Yves Dill Performed: anesthesiologist  Preanesthetic Checklist Completed: patient identified, site marked, surgical consent, pre-op evaluation, timeout performed, IV checked, risks and benefits discussed and monitors and equipment checked Spinal Block Patient position: sitting Prep: Betadine and site prepped and draped Patient monitoring: heart rate, cardiac monitor, continuous pulse ox and blood pressure Approach: midline Injection technique: single-shot Needle Needle type: Whitacre  Needle gauge: 25 G Assessment Sensory level: T8 Additional Notes Time out called.  Patient placed in sitting position.  Back prepped and draped in sterile fashion.  A skin wheal was made in the L3-L4 interspace with 1% Lidocaine plain.  A 25G Whitacre needle was advanced with the return of clear, colorless CSF in all 4 quadrants.  13 mg of MPF marcaine injected with 3 mg of tetracaine.  Patient tolerated the procedure well.

## 2017-02-28 NOTE — Progress Notes (Signed)
Subjective:  POST OP CHECK:  Status post ORIF of right patella fracture. Patient is lying in her hospital bed. Her daughter is at the bedside. Patient reports right knee pain as moderate.  Patient's spinal block has worn off. She did have significant pain late this afternoon which is improving with narcotics.  Patient is eating dinner. She is tolerating an oral diet.  Objective:   VITALS:   Vitals:   02/28/17 1554 02/28/17 1630 02/28/17 1700 02/28/17 1754  BP:  (!) 155/60 (!) 149/62 (!) 181/66  Pulse:  62 70 (!) 58  Resp: Temp: 97.6 F (36.4 C) 97.9 F (36.6 C) 97.4 F (36.3 C)   TempSrc:  Oral Oral   SpO2:  98% 99% 98%  Weight:  61.7 kg (136 lb)    Height:   (1.626 m)      PHYSICAL EXAM:  Right lower extremity: Patient is in a hinge knee brace locked in extension. A Polar Care is in place.  Distally the patient has palpable pedal pulses, intact sensation light touch and can flex and extend her toes and dorsiflex and plantarflex her ankle. Dressing remained clean dry and intact.   LABS  Results for orders placed or performed during the hospital encounter of 02/28/17 (from the past 24 hour(s))  APTT     Status: None   Collection Time: 02/28/17 10:41 AM  Result Value Ref Range   aPTT 30 24 - 36 seconds  Basic metabolic panel     Status: None   Collection Time: 02/28/17 10:41 AM  Result Value Ref Range   Sodium 141 135 - 145 mmol/L   Potassium 4.3 3.5 - 5.1 mmol/L   Chloride 106 101 - 111 mmol/L   CO2 30 22 - 32 mmol/L   Glucose, Bld 94 65 - 99 mg/dL   BUN 15 6 - 20 mg/dL   Creatinine, Ser 1.61 0.44 - 1.00 mg/dL   Calcium 9.3 8.9 - 09.6 mg/dL   GFR calc non Af Amer >60 >60 mL/min   GFR calc Af Amer >60 >60 mL/min   Anion gap 5 5 - 15  CBC WITH DIFFERENTIAL     Status: None   Collection Time: 02/28/17 10:41 AM  Result Value Ref Range   WBC 6.7 3.6 - 11.0 K/uL   RBC 4.30 3.80 - 5.20 MIL/uL   Hemoglobin 12.5 12.0 - 16.0 g/dL   HCT 04.5 40.9 - 81.1  %   MCV 88.3 80.0 - 100.0 fL   MCH 29.2 26.0 - 34.0 pg   MCHC 33.1 32.0 - 36.0 g/dL   RDW 91.4 78.2 - 95.6 %   Platelets 300 150 - 440 K/uL   Neutrophils Relative % 72 %   Neutro Abs 4.9 1.4 - 6.5 K/uL   Lymphocytes Relative 17 %   Lymphs Abs 1.1 1.0 - 3.6 K/uL   Monocytes Relative 9 %   Monocytes Absolute 0.6 0.2 - 0.9 K/uL   Eosinophils Relative 1 %   Eosinophils Absolute 0.0 0 - 0.7 K/uL   Basophils Relative 1 %   Basophils Absolute 0.0 0 - 0.1 K/uL  Protime-INR     Status: None   Collection Time: 02/28/17 10:41 AM  Result Value Ref Range   Prothrombin Time 13.0 11.4 - 15.2 seconds   INR 0.98     Dg Knee 1-2 Views Right  Result Date: 02/28/2017 CLINICAL DATA:  Status post right patellar fracture reduction. EXAM: RIGHT KNEE - 1-2  VIEW; DG C-ARM 61-120 MIN COMPARISON:  Preoperative CT scan of February 21, 2017 and right knee series of February 11, 2017. FINDINGS: The patient has undergone wire fixation of the comminuted, displaced fracture of the patella. Alignment is now near anatomic. There is no postprocedure complication. IMPRESSION: Successful reduction of a previous displaced fracture of the patella. Electronically Signed   By: David  [place] M.D.   On: 02/28/2017 14:09   Dg Knee Right Port  Result Date: 02/28/2017 CLINICAL DATA:  Followup patellar fracture. EXAM: PORTABLE RIGHT KNEE - 1-2 VIEW COMPARISON:  02/21/2017 FINDINGS: Two views show previous ORIF of patellar fracture with screws and cerclage wire. Fragments are near anatomic. No radiographically detectable complication. External brace in place. IMPRESSION: ORIF patellar fracture. Electronically Signed   By: Paulina Fusi M.D.   On: 02/28/2017 15:48   Dg C-arm 1-60 Min  Result Date: 02/28/2017 CLINICAL DATA:  Status post right patellar fracture reduction. EXAM: RIGHT KNEE - 1-2 VIEW; DG C-ARM 61-120 MIN COMPARISON:  Preoperative CT scan of February 21, 2017 and right knee series of February 11, 2017. FINDINGS: The patient has  undergone wire fixation of the comminuted, displaced fracture of the patella. Alignment is now near anatomic. There is no postprocedure complication. IMPRESSION: Successful reduction of a previous displaced fracture of the patella. Electronically Signed   By: David  [place] M.D.   On: 02/28/2017 14:09    Assessment/Plan: Day of Surgery   Active Problems:   Right patella fracture  Patient is stable postop. Her pain is improving. Patient must wear her hinged knee brace locked in extension at all times. She may weight-bear as tolerated on the right lower extremity with the assistance of a walker or crutches. Physical and occupational therapy has been ordered for tomorrow morning. She will continue 24 hours postop antibiotics. She'll begin enteric-coated aspirin 325 mg by mouth twice a day for DVT prophylaxis. Patient will be encouraged to use incentive spirometer while awake. As we checked in the morning. Patient and her daughter understood and agreed with this plan.    Juanell Fairly , MD 02/28/2017, 6:08 PM

## 2017-02-28 NOTE — Anesthesia Post-op Follow-up Note (Cosign Needed)
Anesthesia QCDR form completed.        

## 2017-02-28 NOTE — Op Note (Signed)
02/28/2017  5:59 PM  PATIENT:  Shelly Heath    PRE-OPERATIVE DIAGNOSIS:  S82.041A Displaced comminuted fracture of right patella, init  POST-OPERATIVE DIAGNOSIS:  Same  PROCEDURE:  OPEN REDUCTION INTERNAL (ORIF) FIXATION RIGHT PATELLA FRACTURE  SURGEON:  Thornton Park, MD  ANESTHESIA:   General  PREOPERATIVE INDICATIONS:  KATORA FINI is a  76 y.o. female with a diagnosis of S82.041A Displaced comminuted fracture of right patella who required surgical fixation due to his placement of the fracture and disability it was causing the patient..    I discussed the risks and benefits of surgery. The risks include but are not limited to infection, bleeding requiring blood transfusion, nerve or blood vessel injury, joint stiffness or loss of motion, persistent pain, weakness or instability, malunion, nonunion and hardware failure and the need for further surgery. Medical risks include but are not limited to DVT and pulmonary embolism, myocardial infarction, stroke, pneumonia, respiratory failure and death. Patient understood these risks and wished to proceed.    OPERATIVE IMPLANTS: Zimmer cable ready pin system   OPERATIVE FINDINGS: Displaced right patella fracture with comminution of the inferior patella fragment  OPERATIVE PROCEDURE: Patient was met in the preoperative area with her husband at the bedside. I reviewed the surgical plan for tension band wire fixation of her patella fracture. I reviewed the risks and benefits of surgery. She understood the risks included but are not limited to infection, bleeding, nerve or blood vessel injury, knee stiffness, persistent pain, malunion, nonunion, hardware failure and the need for further surgery. Medical risks include but are not limited to DVT and pulmonary embolism, myocardial infarction, stroke, pneumonia, respiratory failure and death. Patient understood these resources proceed.  Patient brought to the operating room. She was placed supine on  the operative table. She underwent general anesthesia with an LMA. Patient had a tourniquet applied to the right upper thigh. She was prepped and draped in a sterile fashion. A timeout was performed to verify the patient's name, date of birth, medical record number, correct site of surgery correct procedure to be performed. Was also used to verify the patient received antibiotics appropriate instruments, implants and radiographic studies were available in the room. Once all in attendance were in agreement case began.  A vertical incision was made centered over the patella. Full-thickness skin flaps were developed. The fracture was then identified. The retinaculum however was not torn. The fracture was irrigated. I was gently curetted to remove any interposed soft tissue. A small incision was made to the retinaculum to allow for palpation of the undersurface the patella. This allowed for anatomic alignment of the articular surface. The hemarthrosis was evacuated with suction. The joint was irrigated with GU impregnated saline. A large fracture clamp was then used to hold the fracture reduction. The inferior pole was split into 2 fragments by a second vertical fracture line. A single K wire was then advanced across the transverse mid patella fracture site in an inferior to superior direction. The fracture reduction and position of the K wire was then confirmed on AP and lateral C-arm imaging.   Two Zimmer cable ready threaded screws were then advanced through the inferior fragment of the patella across the fracture site into the superior fragment. The trajectory of the scews was confirmed on AP and lateral C-arm imaging.  Once the threaded screws were well positioned a 2 mm drill was used to create a transverse tunnel in the superior fragment.  The wire from the lateral screw was  then advanced through the transverse tunnel from a medial to lateral direction. The cable from the medial screw was then brought up  obliquely from a medial to lateral direction. Both wires were passed through a cramping device. The wires were then tensioned until adequate fracture reduction was achieved.  The wires were then crimped inside the crimping device and the excess cable was cut with a wire cutter. Final AP and lateral C-arm images were taken. This showed the fracture to be well reduced. The articular surface of the patella appeared near anatomic.  The wound was copiously irrigated.  A #5 FiberWire was passed in a cerclage fashion around the circumference of the patella as reinforcement due to the comminution of the inferior pole of the patella. The retinaculum both medially and laterally was repaired with #2 Tycron. The figure-of-eight construct was covered with the soft tissue envelope closed with 0 Vicryl. Subcutaneous tissue was closed with an interrupted 2-0 Vicryl and the skin was approximated with staples. All sharp and instrument counts were correct at the conclusion the case. I was scrubbed and present the entire case. I spoke with the patient's family in the postop consultation room at the conclusion the case with them know the patient was stable in the recovery room in the case had been performed without complication. 

## 2017-02-28 NOTE — Progress Notes (Signed)
Patient is A&O x4. Hinged brace, cryo cuff, and ACE wrap in place. NSL in [facility]. Bed alarm on for safety. Family at bedside.

## 2017-03-01 ENCOUNTER — Encounter: Payer: Self-pay | Admitting: Orthopedic Surgery

## 2017-03-01 LAB — CBC
HCT: 35.2 % (ref 35.0–47.0)
Hemoglobin: 11.6 g/dL — ABNORMAL LOW (ref 12.0–16.0)
MCH: 29.6 pg (ref 26.0–34.0)
MCHC: 33 g/dL (ref 32.0–36.0)
MCV: 89.6 fL (ref 80.0–100.0)
PLATELETS: 260 10*3/uL (ref 150–440)
RBC: 3.93 MIL/uL (ref 3.80–5.20)
RDW: 14.2 % (ref 11.5–14.5)
WBC: 8 10*3/uL (ref 3.6–11.0)

## 2017-03-01 LAB — BASIC METABOLIC PANEL
Anion gap: 3 — ABNORMAL LOW (ref 5–15)
BUN: 10 mg/dL (ref 6–20)
CO2: 31 mmol/L (ref 22–32)
CREATININE: 0.76 mg/dL (ref 0.44–1.00)
Calcium: 8.9 mg/dL (ref 8.9–10.3)
Chloride: 105 mmol/L (ref 101–111)
GFR calc Af Amer: >60 mL/min (ref >60)
Glucose, Bld: 95 mg/dL (ref 65–99)
POTASSIUM: 4 mmol/L (ref 3.5–5.1)
SODIUM: 139 mmol/L (ref 135–145)

## 2017-03-01 MED ORDER — ASPIRIN EC 325 MG PO TBEC: 325.0000 mg | DELAYED_RELEASE_TABLET | Freq: Two times a day (BID) | ORAL | 0 refills | Status: DC

## 2017-03-01 MED ORDER — OXYCODONE HCL 5 MG PO TABS: 5.0000 mg | ORAL_TABLET | ORAL | 0 refills | Status: DC | PRN

## 2017-03-01 MED ORDER — METHOCARBAMOL 500 MG PO TABS: 500.0000 mg | ORAL_TABLET | Freq: Four times a day (QID) | ORAL | 0 refills | Status: DC | PRN

## 2017-03-01 NOTE — Progress Notes (Signed)
Subjective:  Postoperative day #1 status post ORIF of right patella fracture. Patient reports right knee pain as mild to moderate.  Patient states she is feeling well and would like to go home today. Her daughter is at the bedside.  Objective:   VITALS:   Vitals:   02/28/17 2056 02/28/17 2205 02/28/17 2357 03/01/17 0800  BP: (!) 170/62 (!) 171/58 (!) 164/62 (!) 141/57  Pulse: 65 64 72 (!) 58  Resp:    18  Temp:    98.2 F (36.8 C)  TempSrc:    Oral  SpO2: 95% 92% 94% 97%  Weight:      Height:        PHYSICAL EXAM:  Right lower extremity: Patient's dressing is clean dry and intact. She has soft and compressible leg compartments. She has no calf tenderness. She is a negative Homans sign. Patient has palpable pedal pulses, intact sensation light touch in the right lower extremity and can flex and extend her toes and dorsiflex and plantarflex her ankle.  She is wearing a hinged knee brace locked in extension with a Polar Care sleeve over the anterior knee.   LABS  Results for orders placed or performed during the hospital encounter of 02/28/17 (from the past 24 hour(s))  CBC     Status: Abnormal   Collection Time: 03/01/17  5:19 AM  Result Value Ref Range   WBC 8.0 3.6 - 11.0 K/uL   RBC 3.93 3.80 - 5.20 MIL/uL   Hemoglobin 11.6 (L) 12.0 - 16.0 g/dL   HCT 38.7 56.4 - 33.2 %   MCV 89.6 80.0 - 100.0 fL   MCH 29.6 26.0 - 34.0 pg   MCHC 33.0 32.0 - 36.0 g/dL   RDW 95.1 88.4 - 16.6 %   Platelets 260 150 - 440 K/uL  Basic metabolic panel     Status: Abnormal   Collection Time: 03/01/17  5:19 AM  Result Value Ref Range   Sodium 139 135 - 145 mmol/L   Potassium 4.0 3.5 - 5.1 mmol/L   Chloride 105 101 - 111 mmol/L   CO2 31 22 - 32 mmol/L   Glucose, Bld 95 65 - 99 mg/dL   BUN 10 6 - 20 mg/dL   Creatinine, Ser 0.63 0.44 - 1.00 mg/dL   Calcium 8.9 8.9 - 01.6 mg/dL   GFR calc non Af Amer >60 >60 mL/min   GFR calc Af Amer >60 >60 mL/min   Anion gap 3 (L) 5 - 15    Dg Knee 1-2  Views Right  Result Date: 02/28/2017 CLINICAL DATA:  Status post right patellar fracture reduction. EXAM: RIGHT KNEE - 1-2 VIEW; DG C-ARM 61-120 MIN COMPARISON:  Preoperative CT scan of February 21, 2017 and right knee series of February 11, 2017. FINDINGS: The patient has undergone wire fixation of the comminuted, displaced fracture of the patella. Alignment is now near anatomic. There is no postprocedure complication. IMPRESSION: Successful reduction of a previous displaced fracture of the patella. Electronically Signed   By: David  [place] M.D.   On: 02/28/2017 14:09   Dg Knee Right Port  Result Date: 02/28/2017 CLINICAL DATA:  Followup patellar fracture. EXAM: PORTABLE RIGHT KNEE - 1-2 VIEW COMPARISON:  02/21/2017 FINDINGS: Two views show previous ORIF of patellar fracture with screws and cerclage wire. Fragments are near anatomic. No radiographically detectable complication. External brace in place. IMPRESSION: ORIF patellar fracture. Electronically Signed   By: Paulina Fusi M.D.   On: 02/28/2017 15:48   Dg  C-arm 1-60 Min  Result Date: 02/28/2017 CLINICAL DATA:  Status post right patellar fracture reduction. EXAM: RIGHT KNEE - 1-2 VIEW; DG C-ARM 61-120 MIN COMPARISON:  Preoperative CT scan of February 21, 2017 and right knee series of February 11, 2017. FINDINGS: The patient has undergone wire fixation of the comminuted, displaced fracture of the patella. Alignment is now near anatomic. There is no postprocedure complication. IMPRESSION: Successful reduction of a previous displaced fracture of the patella. Electronically Signed   By: David  [place] M.D.   On: 02/28/2017 14:09    Assessment/Plan: 1 Day Post-Op   Active Problems:   Right patella fracture  Patient is doing well postop. She has been discharged home today with home health PT, rolling walker and bedside commode. She was instructed to take enteric-coated aspirin 325 mg by mouth twice a day for DVT prophylaxis. I recommend she continue strict  elevation and use of the Polar Care throughout the weekend. She will use her walker for assistance with ambulation and may weight-bear as tolerated on the right lower extremity with the knee brace on and in full extension at all times.    Juanell Fairly , MD 03/01/2017, 2:17 PM

## 2017-03-01 NOTE — Discharge Summary (Signed)
Physician Discharge Summary  Patient ID: Shelly Heath MRN: [id number] DOB/AGE: Mar 31, 1941 76 y.o.  Admit date: 02/28/2017 Discharge date: 03/01/2017  Admission Diagnoses:  Displaced comminuted fracture of right patella <principal problem not specified>  Discharge Diagnoses:  Displaced comminuted fracture of right patella Active Problems:   Right patella fracture s/p open reduction, internal fixation of right patella fracture  Past Medical History:  Diagnosis Date  . Allergic rhinitis   . Anemia   . Bell's palsy   . Elevated liver enzymes   . GERD (gastroesophageal reflux disease)   . Hyperlipidemia   . Hypertension   . Leukopenia   . Osteoporosis, post-menopausal   . Pneumonia     Surgeries: Procedure(s): OPEN REDUCTION INTERNAL (ORIF) FIXATION PATELLA on 02/28/2017   Consultants (if any):   Discharged Condition: Improved  Hospital Course: Shelly Heath is an 76 y.o. female who was admitted 02/28/2017 with a diagnosis of  S82.041A Displaced comminuted fracture of right patella who went to the operating room on 02/28/2017 and Underwent an uncomplicated open reduction internal fixation of the right patella fracture.  She was admitted postoperatively for pain control..    She was given perioperative antibiotics:  Anti-infectives    Start     Dose/Rate Route Frequency Ordered Stop   02/28/17 1800  ceFAZolin (ANCEF) IVPB 2 g/50 mL premix  Status:  Discontinued     2 g 100 mL/hr over 30 Minutes Intravenous Every 6 hours 02/28/17 1627 02/28/17 1636   02/28/17 1800  ceFAZolin (ANCEF) 2 g in dextrose 5 % 100 mL IVPB     2 g 200 mL/hr over 30 Minutes Intravenous Every 6 hours 02/28/17 1637 03/01/17 0024   02/28/17 1630  ceFAZolin (ANCEF) IVPB 2g/100 mL premix  Status:  Discontinued     2 g 200 mL/hr over 30 Minutes Intravenous Every 6 hours 02/28/17 1622 02/28/17 1627   02/28/17 0942  ceFAZolin (ANCEF) 2-4 GM/100ML-% IVPB    Comments:  KENNEDY, ASHLEY: cabinet override   02/28/17 0942 02/28/17 2144   02/28/17 0034  ceFAZolin (ANCEF) IVPB 2g/100 mL premix     2 g 200 mL/hr over 30 Minutes Intravenous On call to O.R. 02/28/17 0034 02/28/17 1222    .  She was given sequential compression devices, early ambulation, and enteric coated aspirin for DVT prophylaxis.  She benefited maximally from the hospital stay and there were no complications.    Recent vital signs:  Vitals:   02/28/17 2357 03/01/17 0800  BP: (!) 164/62 (!) 141/57  Pulse: 72 (!) 58  Resp:  18  Temp:  98.2 F (36.8 C)    Recent laboratory studies:  Lab Results  Component Value Date   HGB 11.6 (L) 03/01/2017   HGB 12.5 02/28/2017   HGB 12.6 02/26/2017   Lab Results  Component Value Date   WBC 8.0 03/01/2017   PLT 260 03/01/2017   Lab Results  Component Value Date   INR 0.98 02/28/2017   Lab Results  Component Value Date   NA 139 03/01/2017   K 4.0 03/01/2017   CL 105 03/01/2017   CO2 31 03/01/2017   BUN 10 03/01/2017   CREATININE 0.76 03/01/2017   GLUCOSE 95 03/01/2017    Discharge Medications:   Allergies as of 03/01/2017      Reactions   Alendronate Other (See Comments)   dyspepsia   Fish Oil Other (See Comments)   dyspepsia   Other Nausea And Vomiting   liquid medications(Colonoscopy prep)  Raloxifene Other (See Comments)   Elevated transaminases   Risedronate Other (See Comments)   dyspepsia      Medication List    STOP taking these medications   aspirin 81 MG tablet Replaced by:  aspirin EC 325 MG tablet   oxyCODONE-acetaminophen 5-325 MG tablet Commonly known as:  ROXICET     TAKE these medications   aspirin EC 325 MG tablet Take 1 tablet (325 mg total) by mouth 2 (two) times daily. Replaces:  aspirin 81 MG tablet   calcium carbonate 1500 (600 Ca) MG Tabs tablet Commonly known as:  OSCAL Take 600 mg by mouth daily with breakfast.   cetirizine 10 MG tablet Commonly known as:  ZYRTEC Take 10 mg by mouth daily as needed.   famotidine 10  MG tablet Commonly known as:  PEPCID Take 10 mg by mouth as needed for heartburn or indigestion.   losartan 25 MG tablet Commonly known as:  COZAAR Take 25 mg by mouth daily.   methocarbamol 500 MG tablet Commonly known as:  ROBAXIN Take 1 tablet (500 mg total) by mouth every 6 (six) hours as needed for muscle spasms.   oxyCODONE 5 MG immediate release tablet Commonly known as:  Oxy IR/ROXICODONE Take 1-2 tablets (5-10 mg total) by mouth every 4 (four) hours as needed for breakthrough pain ((for MODERATE breakthrough pain)).   simvastatin 40 MG tablet Commonly known as:  ZOCOR Take 40 mg by mouth at bedtime.            Durable Medical Equipment        Start     Ordered   03/01/17 1425  For home use only DME Bedside commode  Once    Question:  Patient needs a bedside commode to treat with the following condition  Answer:  Right patella fracture   03/01/17 1424   03/01/17 1336  For home use only DME Walker rolling  Once    Question:  Patient needs a walker to treat with the following condition  Answer:  Weakness   03/01/17 1336      Diagnostic Studies: Dg Knee 1-2 Views Right  Result Date: 02/28/2017 CLINICAL DATA:  Status post right patellar fracture reduction. EXAM: RIGHT KNEE - 1-2 VIEW; DG C-ARM 61-120 MIN COMPARISON:  Preoperative CT scan of February 21, 2017 and right knee series of February 11, 2017. FINDINGS: The patient has undergone wire fixation of the comminuted, displaced fracture of the patella. Alignment is now near anatomic. There is no postprocedure complication. IMPRESSION: Successful reduction of a previous displaced fracture of the patella. Electronically Signed   By: David  [place] M.D.   On: 02/28/2017 14:09   Ct Knee Right Wo Contrast  Result Date: 02/21/2017 CLINICAL DATA:  Evaluate comminuted patellar fracture. EXAM: CT OF THE right KNEE WITHOUT CONTRAST TECHNIQUE: Multidetector CT imaging of the right knee was performed according to the standard protocol.  Multiplanar CT image reconstructions were also generated. COMPARISON:  Radiographs 02/11/2017 FINDINGS: There is a comminuted and displaced fracture involving the patella. The main fracture fragments are separated by approximately 15 mm. The inferior pole of the patella is fragmented. The main inferior fragment is also rotated externally 90 degrees so that the articular surface is facing the upper pole fragment. The femur, tibia and fibula are intact. Moderate-sized joint effusion. The patellar tendon is retracted and redundant. The IMPRESSION: Comminuted and displaced fracture involving the patella as described above. No other fractures are identified. Electronically Signed   By: Demetrius Charity.  Gallerani M.D.   On: 02/21/2017 11:19   Dg Knee Complete 4 Views Right  Result Date: 02/11/2017 CLINICAL DATA:  Initial evaluation for acute trauma. EXAM: RIGHT KNEE - COMPLETE 4+ VIEW COMPARISON:  None. FINDINGS: There is an acute transverse comminuted fracture extending through the mid pole of the patella. Superior aspect of the talus subluxed superiorly. Overlying soft tissue swelling. Distal femur and proximal tibia intact. IMPRESSION: Acute comminuted transverse patellar fracture. Electronically Signed   By: Rise Mu M.D.   On: 02/11/2017 13:18   Dg Knee Right Port  Result Date: 02/28/2017 CLINICAL DATA:  Followup patellar fracture. EXAM: PORTABLE RIGHT KNEE - 1-2 VIEW COMPARISON:  02/21/2017 FINDINGS: Two views show previous ORIF of patellar fracture with screws and cerclage wire. Fragments are near anatomic. No radiographically detectable complication. External brace in place. IMPRESSION: ORIF patellar fracture. Electronically Signed   By: Paulina Fusi M.D.   On: 02/28/2017 15:48   Dg C-arm 1-60 Min  Result Date: 02/28/2017 CLINICAL DATA:  Status post right patellar fracture reduction. EXAM: RIGHT KNEE - 1-2 VIEW; DG C-ARM 61-120 MIN COMPARISON:  Preoperative CT scan of February 21, 2017 and right knee series  of February 11, 2017. FINDINGS: The patient has undergone wire fixation of the comminuted, displaced fracture of the patella. Alignment is now near anatomic. There is no postprocedure complication. IMPRESSION: Successful reduction of a previous displaced fracture of the patella. Electronically Signed   By: David  [place] M.D.   On: 02/28/2017 14:09    Disposition: 01-Home or Self Care  Discharge Instructions    Call MD / Call 911    Complete by:  As directed    If you experience chest pain or shortness of breath, CALL 911 and be transported to the hospital emergency room.  If you develope a fever above 101 F, pus (white drainage) or increased drainage or redness at the wound, or calf pain, call your surgeon's office.   Constipation Prevention    Complete by:  As directed    Drink plenty of fluids.  Prune juice may be helpful.  You may use a stool softener, such as Colace (over the counter) 100 mg twice a day.  Use MiraLax (over the counter) for constipation as needed.   Diet general    Complete by:  As directed    Discharge instructions    Complete by:  As directed    Continue strict elevation of the right lower extremity. Patient may continue wearing her Polar Care sleeve to help reduce swelling and pain. Patient must wear her hinged knee brace locked in extension at all times. Patient needs to use a walker for assistance with ambulation and may weight-bear as tolerated on the right lower extremity. If the patient elects to shower she must cover the entire brace and bandage with a trash bag with an elastic band at the top.  Patient will leave her dressing on until her follow-up in the office in 10-14 days.   Driving restrictions    Complete by:  As directed    No driving for 1-61 weeks   Increase activity slowly as tolerated    Complete by:  As directed    Lifting restrictions    Complete by:  As directed    No lifting for 12-16 weeks         Signed: Juanell Fairly ,MD 03/01/2017, 2:31  PM

## 2017-03-01 NOTE — Evaluation (Signed)
Physical Therapy Evaluation Patient Details Name: Shelly Heath MRN: [id number] DOB: 24-May-1941 Today's Date: 03/01/2017   History of Present Illness  76 y.o. female who presents with a diagnosis of a displaced, comminuted fracture of right patella. Patient sustained the injury during a fall at work. Patient has been unable to actively extend her knee. Plain films and a CT scan again performed showing significant displacement and comminution of the fracture. Pt had been trying to get around with crutches at home, ultimately needed patellar ORIF 02/28/17.    Clinical Impression  Pt showed great effort with PT exam and despite some minimal impulsivity she was able to get up/down from bed/recliner multiple times w/o direct assist. She was able to negotiate up/down steps going backward with walker, but did struggle with bearing weight through R LE enough and distributing weight through UEs/walker.  Pt with min/mod pain at rest, but with attempts at AROM with R LE was very pain limited.  Overall pt moving well, but needed a lot of cuing and explanation for safety, expectations and reinforcement.    Follow Up Recommendations Home health PT    Equipment Recommendations  Rolling walker with 5" wheels;3in1 (PT)    Recommendations for Other Services       Precautions / Restrictions Precautions Precautions: Fall Restrictions RLE Weight Bearing: Weight bearing as tolerated      Mobility  Bed Mobility Overal bed mobility: Modified Independent             General bed mobility comments: Pt needing to use UEs to hold KI to move LE, shows good motivation to do mobility on her own  Transfers Overall transfer level: Modified independent Equipment used: Rolling walker (2 wheeled)             General transfer comment: Pt was able to rise from sitting with UE use of rails and generally needed consistent but minimal VCs for LE positioning and sequencing.  4 sit to stand bouts t/o session.    Ambulation/Gait Ambulation/Gait assistance: Min guard Ambulation Distance (Feet): 75 Feet Assistive device: Rolling walker (2 wheeled)       General Gait Details: Pt initially very hesitant to put much weight through the R LE, but after some cuing and cautious trials she was able to use it appropriately and with improved confidence.   Stairs Stairs: Yes Stairs assistance: Min assist Stair Management: Backwards;With walker Number of Stairs: 2 General stair comments: went up/down 2 steps X 2. Pt very cautious and struggled initially but with heavy cuing, encouragement and explanation she was able to negotiate up/down w/o physical assist  Wheelchair Mobility    Modified Rankin (Stroke Patients Only)       Balance Overall balance assessment: Modified Independent                                           Pertinent Vitals/Pain Pain Assessment: 0-10 Pain Score: 5     Home Living Family/patient expects to be discharged to:: Private residence Living Arrangements: Spouse/significant other Available Help at Discharge: Family (daughter lives next door) Type of Home: House Home Access: Stairs to enter   [employer] of Steps: 1.5 steps w/o rails, 5 steps with wide b/l rails   Home Equipment: Walker - standard      Prior Function Level of Independence: Independent         Comments: 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, working, Psychiatric nurse        Extremity/Trunk Assessment   Upper Extremity Assessment Upper Extremity Assessment: Overall WFL for tasks assessed    Lower Extremity Assessment Lower Extremity Assessment: RLE deficits/detail RLE Deficits / Details: pain with most quad activity, unable to lift against gravity, pain limited hip AB/AD       Communication   Communication: No difficulties  Cognition Arousal/Alertness: Awake/alert Behavior During Therapy: WFL for tasks assessed/performed Overall Cognitive Status: Within  Functional Limits for tasks assessed                                        General Comments      Exercises General Exercises - Lower Extremity Ankle Circles/Pumps: Strengthening;10 reps Quad Sets: Strengthening;10 reps Gluteal Sets: Strengthening;10 reps Hip ABduction/ADduction: AAROM;10 reps Straight Leg Raises: PROM;5 reps   Assessment/Plan    PT Assessment Patient needs continued PT services  PT Problem List Decreased strength;Decreased range of motion;Decreased activity tolerance;Decreased mobility;Decreased balance;Decreased coordination;Decreased knowledge of use of DME;Decreased safety awareness;Pain       PT Treatment Interventions Gait training;DME instruction;Stair training;Functional mobility training;Therapeutic activities;Therapeutic exercise;Balance training;Neuromuscular re-education;Patient/family education    PT Goals (Current goals can be found in the Care Plan section)  Acute Rehab PT Goals Patient Stated Goal: go home PT Goal Formulation: With patient Time For Goal Achievement: 03/15/17 Potential to Achieve Goals: Fair    Frequency BID   Barriers to discharge        Co-evaluation               End of Session Equipment Utilized During Treatment: Gait belt;Right knee immobilizer Activity Tolerance: Patient tolerated treatment well Patient left: with chair alarm set;with call bell/phone within reach;with family/visitor present   PT Visit Diagnosis: Muscle weakness (generalized) (M62.81);Difficulty in walking, not elsewhere classified (R26.2)    Time: 1610-9604 PT Time Calculation (min) (ACUTE ONLY): 46 min   Charges:   PT Evaluation $PT Eval Low Complexity: 1 Procedure PT Treatments $Gait Training: 8-22 mins $Therapeutic Exercise: 8-22 mins   PT G Codes:        Malachi Pro, DPT 03/01/2017, 10:48 AM

## 2017-03-01 NOTE — Care Management Note (Signed)
Case Management Note  Patient Details  Name: [name] MRN: [id number] Date of Birth: 05-01-1941  Subjective/Objective:   POD # 1 patella fracture. PT recommending home health PT. TC to [facility] comp case manager, [place], received a call back from her.  [phone]). She requested copy of PT recommendations and [facility]. Faxed to [phone]. Sharyn Lull came to the hospital to visit patient and is in speaking with her now. Requesting home PT, walker and bsc. Ordered DME from [facility].  Met with patient prior to case manager coming. Patient lives at home with her spouse. She was working prior to admission. She is agreeable to home health and using what ever [facility] will pay for.  Most likely [facility].  Waiting on decision form workman's comp.case Freight forward[employer].                    Action/Plan:   Expected Discharge Date:  03/01/17               Expected Discharge Plan:  [place]  In-House Referral:     Discharge planning Services  CM Consult  Post Acute Care Choice:  Home Health, Durable Medical Equipment Choice offered to:  Patient  DME Arranged:  Bedside commode, Walker rolling DME Agency:  [place]:  PT [place]:  [place]  Status of Service:  In process, will continue to follow  If discussed at Long Length of Stay Meetings, dates discussed:    Additional Comments:  Jolly Mango, RN 03/01/2017, 11:18 AM

## 2017-03-01 NOTE — Evaluation (Signed)
Occupational Therapy Evaluation Patient Details Name: Shelly Heath MRN: [id number] DOB: 06-05-1941 Today's Date: 03/01/2017    History of Present Illness 76 y.o. female who presents with a diagnosis of a displaced, comminuted fracture of right patella. Patient sustained the injury during a fall at work. Patient has been unable to actively extend her knee. Plain films and a CT scan again performed showing significant displacement and comminution of the fracture. Pt had been trying to get around with crutches at home, ultimately needed patellar ORIF 02/28/17.     Clinical Impression   Pt is 76 year old female s/p R ORIF of knee 02/28/17.  Pt was independent in all ADLs prior to surgery and is eager to return to PLOF.  Pt currently requires min guard for LB dressing using AD while in seated position due to pain and limited AROM of R  knee.  Pt and family instructed in use of AD rec for LB dressing and able to demonstrate teach back.  Rec use of a reacher and sock aid for LB dressing and non slip mat be purchased for shower stall and non slip adhesive strips to bath seat to prevent slipping off of seat.  Rec OT HH to assess safety and set up at home but family declined.       Follow Up Recommendations  No OT follow up    Equipment Recommendations       Recommendations for Other Services       Precautions / Restrictions Precautions Precautions: Fall Precaution Comments: knee hinge brace in extension at all times Restrictions Weight Bearing Restrictions: Yes RLE Weight Bearing: Weight bearing as tolerated      Mobility Bed Mobility Overal bed mobility: Modified Independent             General bed mobility comments: Pt needing to use UEs to hold KI to move LE, shows good motivation to do mobility on her own  Transfers Overall transfer level: Modified independent Equipment used: Rolling walker (2 wheeled)             General transfer comment: Pt was able to rise from sitting  with UE use of rails and generally needed consistent but minimal VCs for LE positioning and sequencing.  4 sit to stand bouts t/o session.     Balance Overall balance assessment: Modified Independent                                         ADL either performed or assessed with clinical judgement   ADL Overall ADL's : Needs assistance/impaired Eating/Feeding: Independent;Set up   Grooming: Wash/dry hands;Wash/dry face;Oral care;Applying deodorant;Brushing hair;Independent;Set up           Upper Body Dressing : Independent;Set up   Lower Body Dressing: Min guard;Set up;With adaptive equipment Lower Body Dressing Details (indicate cue type and reason): Pt able to use reacher and sock aid to don/doff socks and simulate pants over feet with reacher with rec to dress R side first.               General ADL Comments: Pt, husband and daughter Shelly Heath educated in rec AD for LB dressing and reacher to pick up items off floor to prevent falls when reaching to floor.  Also rec non slip mat by shower since she is currently using a bath towel and rec non slip strips to edge of  built in shower seat since she indicated it gets slippery when wet.  She indicated floor is textured and non slip.     Vision Patient Visual Report: No change from baseline       Perception     Praxis      Pertinent Vitals/Pain Pain Assessment: 0-10 Pain Score: 4  Pain Location: R knee Pain Descriptors / Indicators: Aching;Discomfort;Sore Pain Intervention(s): Limited activity within patient's tolerance;Monitored during session;Premedicated before session     Hand Dominance Right   Extremity/Trunk Assessment Upper Extremity Assessment Upper Extremity Assessment: Overall WFL for tasks assessed   Lower Extremity Assessment Lower Extremity Assessment: Defer to PT evaluation RLE Deficits / Details: pain with most quad activity, unable to lift against gravity, pain limited hip AB/AD        Communication Communication Communication: No difficulties   Cognition Arousal/Alertness: Awake/alert Behavior During Therapy: WFL for tasks assessed/performed Overall Cognitive Status: Within Functional Limits for tasks assessed                                     General Comments       Exercises Exercises: General Lower Extremity General Exercises - Lower Extremity Ankle Circles/Pumps: Strengthening;10 reps Quad Sets: Strengthening;10 reps Gluteal Sets: Strengthening;10 reps Hip ABduction/ADduction: AAROM;10 reps Straight Leg Raises: PROM;5 reps   Shoulder Instructions      Home Living Family/patient expects to be discharged to:: Private residence Living Arrangements: Spouse/significant other Available Help at Discharge: Family Type of Home: House Home Access: Stairs to enter [employer] of Steps: 1.5 steps w/o rails, 5 steps with wide b/l rails Entrance Stairs-Rails: None Home Layout: Two level     Bathroom Shower/Tub: Walk-in shower;Door   [employer] Toilet: Handicapped height Bathroom Accessibility: Yes How Accessible: Accessible via walker Home Equipment: Walker - standard;Shower seat;Hand held shower head;Crutches          Prior Functioning/Environment Level of Independence: Independent        Comments: Pt able to be active, working, etc        OT Problem List: Pain      OT Treatment/Interventions:      OT Goals(Current goals can be found in the care plan section) Acute Rehab OT Goals Patient Stated Goal: go home OT Goal Formulation: With patient/family  OT Frequency:     Barriers to D/C:            Co-evaluation              End of Session    Activity Tolerance: Patient tolerated treatment well Patient left: in chair;with call bell/phone within reach;with family/visitor present;with chair alarm set  OT Visit Diagnosis: Pain;Muscle weakness (generalized) (M62.81) Pain - Right/Left: Right Pain - part of  body: Knee                Time: 1020-1100 OT Time Calculation (min): 40 min Charges:  OT General Charges $OT Visit: 1 Procedure OT Evaluation $OT Eval Low Complexity: 1 Procedure OT Treatments $Self Care/Home Management : 23-37 mins G-Codes:     Susanne Borders, OTR/L ascom [phone] 03/01/17, 11:11 AM

## 2017-03-01 NOTE — Care Management (Signed)
Spoke with [employer]. Home health order needs to be faxed to "[facility]" (call [phone] [phone] for fax number). Marcelino Duster will need the order faxed to her as well [phone]).

## 2017-03-01 NOTE — Progress Notes (Signed)
Foley d/c'd at 0625.

## 2017-03-01 NOTE — Anesthesia Postprocedure Evaluation (Signed)
Anesthesia Post Note  Patient: Shelly Heath  Procedure(s) Performed: Procedure(s) (LRB): OPEN REDUCTION INTERNAL (ORIF) FIXATION PATELLA (Right)  Patient location during evaluation: Nursing Unit Anesthesia Type: Spinal Level of consciousness: awake, awake and alert and oriented Pain management: pain level controlled Vital Signs Assessment: post-procedure vital signs reviewed and stable Respiratory status: spontaneous breathing, nonlabored ventilation and respiratory function stable Cardiovascular status: blood pressure returned to baseline and stable Postop Assessment: no headache and no backache Anesthetic complications: no     Last Vitals:  Vitals:   02/28/17 2205 02/28/17 2357  BP: (!) 171/58 (!) 164/62  Pulse: 64 72  Resp:    Temp:      Last Pain:  Vitals:   03/01/17 0609  TempSrc:   PainSc: Asleep                 Ginger Carne

## 2017-03-01 NOTE — Care Management Note (Addendum)
Case Management Note  Patient Details  Name: Shelly Heath MRN: [id number] Date of Birth: 03-Dec-1940  Subjective/Objective:  Faxed home health orders and H & P  to Marcelino Duster [employer] comp) [phone]) and to [facility] [phone]). TC to [place] to notify her of fax.                  Action/Plan: [facility] notified of discharge. DME delivered.   Expected Discharge Date:  03/01/17               Expected Discharge Plan:  Home w Home Health Services  In-House Referral:     Discharge planning Services  CM Consult  Post Acute Care Choice:  Home Health, Durable Medical Equipment Choice offered to:  Patient  DME Arranged:  Bedside commode, Walker rolling DME Agency:  [facility]  HH Arranged:  PT [facility] Agency:  [facility]  Status of Service:  Completed, signed off  If discussed at Long Length of Stay Meetings, dates discussed:    Additional Comments:  Marily Memos, RN 03/01/2017, 2:39 PM

## 2017-03-01 NOTE — Progress Notes (Signed)
Patient was discharged home with family. Discharge instructions reviewed while daughter was present. IV removed with cath intact. Script given to patient. Reviewed last dose of meds given. Discussed activity, diet, S&S of pain meds. Allowed time for questions.

## 2017-06-24 ENCOUNTER — Other Ambulatory Visit: Payer: Self-pay | Admitting: Internal Medicine

## 2017-06-24 DIAGNOSIS — Z1231 Encounter for screening mammogram for malignant neoplasm of breast: Secondary | ICD-10-CM

## 2017-07-05 ENCOUNTER — Ambulatory Visit
Admission: RE | Admit: 2017-07-05 | Discharge: 2017-07-05 | Disposition: A | Discharge status: Home / Self Care | Payer: BC Managed Care – PPO | Source: Ambulatory Visit | Attending: Internal Medicine | Admitting: Internal Medicine

## 2017-07-05 DIAGNOSIS — Z1231 Encounter for screening mammogram for malignant neoplasm of breast: Secondary | ICD-10-CM | POA: Insufficient documentation

## 2018-02-14 ENCOUNTER — Other Ambulatory Visit: Payer: Self-pay | Admitting: Orthopedic Surgery

## 2018-02-14 DIAGNOSIS — S82041D Displaced comminuted fracture of right patella, subsequent encounter for closed fracture with routine healing: Secondary | ICD-10-CM

## 2018-02-18 ENCOUNTER — Ambulatory Visit
Admission: RE | Admit: 2018-02-18 | Discharge: 2018-02-18 | Disposition: A | Discharge status: Home / Self Care | Payer: No Typology Code available for payment source | Source: Ambulatory Visit | Attending: Orthopedic Surgery | Admitting: Orthopedic Surgery

## 2018-02-18 DIAGNOSIS — S82041D Displaced comminuted fracture of right patella, subsequent encounter for closed fracture with routine healing: Secondary | ICD-10-CM | POA: Diagnosis not present

## 2018-03-10 ENCOUNTER — Other Ambulatory Visit: Payer: Self-pay | Admitting: Orthopedic Surgery

## 2018-03-20 ENCOUNTER — Encounter
Admission: RE | Admit: 2018-03-20 | Discharge: 2018-03-20 | Disposition: A | Discharge status: Home / Self Care | Payer: No Typology Code available for payment source | Source: Ambulatory Visit | Attending: Orthopedic Surgery | Admitting: Orthopedic Surgery

## 2018-03-20 ENCOUNTER — Other Ambulatory Visit: Payer: Self-pay

## 2018-03-20 DIAGNOSIS — Z01812 Encounter for preprocedural laboratory examination: Secondary | ICD-10-CM | POA: Diagnosis present

## 2018-03-20 DIAGNOSIS — I10 Essential (primary) hypertension: Secondary | ICD-10-CM | POA: Insufficient documentation

## 2018-03-20 DIAGNOSIS — E785 Hyperlipidemia, unspecified: Secondary | ICD-10-CM | POA: Insufficient documentation

## 2018-03-20 LAB — CBC WITH DIFFERENTIAL/PLATELET
BASOS ABS: 0 10*3/uL (ref 0–0.1)
BASOS PCT: 1 %
EOS PCT: 1 %
Eosinophils Absolute: 0.1 10*3/uL (ref 0–0.7)
HEMATOCRIT: 37.6 % (ref 35.0–47.0)
Hemoglobin: 12.8 g/dL (ref 12.0–16.0)
Lymphocytes Relative: 26 %
Lymphs Abs: 1.5 10*3/uL (ref 1.0–3.6)
MCH: 30.2 pg (ref 26.0–34.0)
MCHC: 34 g/dL (ref 32.0–36.0)
MCV: 89 fL (ref 80.0–100.0)
MONO ABS: 0.5 10*3/uL (ref 0.2–0.9)
MONOS PCT: 8 %
NEUTROS ABS: 3.7 10*3/uL (ref 1.4–6.5)
Neutrophils Relative %: 64 %
PLATELETS: 236 10*3/uL (ref 150–440)
RBC: 4.23 MIL/uL (ref 3.80–5.20)
RDW: 13.8 % (ref 11.5–14.5)
WBC: 5.8 10*3/uL (ref 3.6–11.0)

## 2018-03-20 LAB — BASIC METABOLIC PANEL
ANION GAP: 6 (ref 5–15)
BUN: 18 mg/dL (ref 6–20)
CALCIUM: 9 mg/dL (ref 8.9–10.3)
CO2: 29 mmol/L (ref 22–32)
CREATININE: 0.75 mg/dL (ref 0.44–1.00)
Chloride: 106 mmol/L (ref 101–111)
GFR calc Af Amer: >60 mL/min (ref >60)
GLUCOSE: 120 mg/dL — AB (ref 65–99)
Potassium: 3.5 mmol/L (ref 3.5–5.1)
Sodium: 141 mmol/L (ref 135–145)

## 2018-03-20 LAB — APTT: aPTT: 32 seconds (ref 24–36)

## 2018-03-20 LAB — PROTIME-INR
INR: 0.92
PROTHROMBIN TIME: 12.3 s (ref 11.4–15.2)

## 2018-03-20 NOTE — Patient Instructions (Signed)
Your procedure is scheduled on: Tuesday 04/01/18 Report to [place]. To find out your arrival time please call [phone] between 1PM - 3PM on Monday 03/31/18.  Remember: Instructions that are not followed completely may result in serious medical risk, up to and including death, or upon the discretion of your surgeon and anesthesiologist your surgery may need to be rescheduled.     _X__ 1. Do not eat food after midnight the night before your procedure.                 No gum chewing or hard candies. You may drink clear liquids up to 2 hours                 before you are scheduled to arrive for your surgery- DO not drink clear                 liquids within 2 hours of the start of your surgery.                 Clear Liquids include:  water, apple juice without pulp, clear carbohydrate                 drink such as Clearfast or Gatorade, Black Coffee or Tea (Do not add                 anything to coffee or tea).  __X__2.  On the morning of surgery brush your teeth with toothpaste and water, you                 may rinse your mouth with mouthwash if you wish.  Do not swallow any              toothpaste of mouthwash.     _X__ 3.  No Alcohol for 24 hours before or after surgery.   _X__ 4.  Do Not Smoke or use e-cigarettes For 24 Hours Prior to Your Surgery.                 Do not use any chewable tobacco products for at least 6 hours prior to                 surgery.  ____  5.  Bring all medications with you on the day of surgery if instructed.   __X__  6.  Notify your doctor if there is any change in your medical condition      (cold, fever, infections).     Do not wear jewelry, make-up, hairpins, clips or nail polish. Do not wear lotions, powders, or perfumes.  Do not shave 48 hours prior to surgery. Men may shave face and neck. Do not bring valuables to the hospital.    [facility] is not responsible for any belongings or  valuables.  Contacts, dentures/partials or body piercings may not be worn into surgery. Bring a case for your contacts, glasses or hearing aids, a denture cup will be supplied. Leave your suitcase in the car. After surgery it may be brought to your room. For patients admitted to the hospital, discharge time is determined by your treatment team.   Patients discharged the day of surgery will not be allowed to drive home.   Please read over the following fact sheets that you were given:   MRSA Information  __X__ Take these medicines the morning of surgery with A SIP OF WATER:  1. pepcid  2. Zyrtec if needed  3.   4.  5.  6.  ____ Fleet Enema (as directed)   __X__ Use CHG Soap/SAGE wipes as directed  ____ Use inhalers on the day of surgery  ____ Stop metformin/Janumet/Farxiga 2 days prior to surgery    ____ Take 1/2 of usual insulin dose the night before surgery. No insulin the morning          of surgery.   __X__ Stop Blood Thinners Coumadin/Plavix/Xarelto/Pleta/Pradaxa/Eliquis/Effient/Aspirin  on (STOPPED)  Or contact your Surgeon, Cardiologist or Medical Doctor regarding  ability to stop your blood thinners  __X__ Stop Anti-inflammatories 7 days before surgery such as Advil, Ibuprofen, Motrin,  BC or Goodies Powder, Naprosyn, Naproxen, Aleve, Aspirin    __X__ Stop all herbal supplements, fish oil or vitamin E until after surgery.    ____ Bring C-Pap to the hospital.

## 2018-03-20 NOTE — Pre-Procedure Instructions (Signed)
Faxed copy of EKG to Dr Priscella MannPenwarden for review. Call back received. No new orders.

## 2018-03-25 ENCOUNTER — Other Ambulatory Visit: Payer: Self-pay

## 2018-03-28 IMAGING — DX DG KNEE COMPLETE 4+V*R*
4 series · 4 of 4 positions shown · non-contrast
Comparison: None.

CLINICAL DATA: Initial evaluation for acute trauma.

EXAM:
RIGHT KNEE - COMPLETE 4+ VIEW

[knee ap]
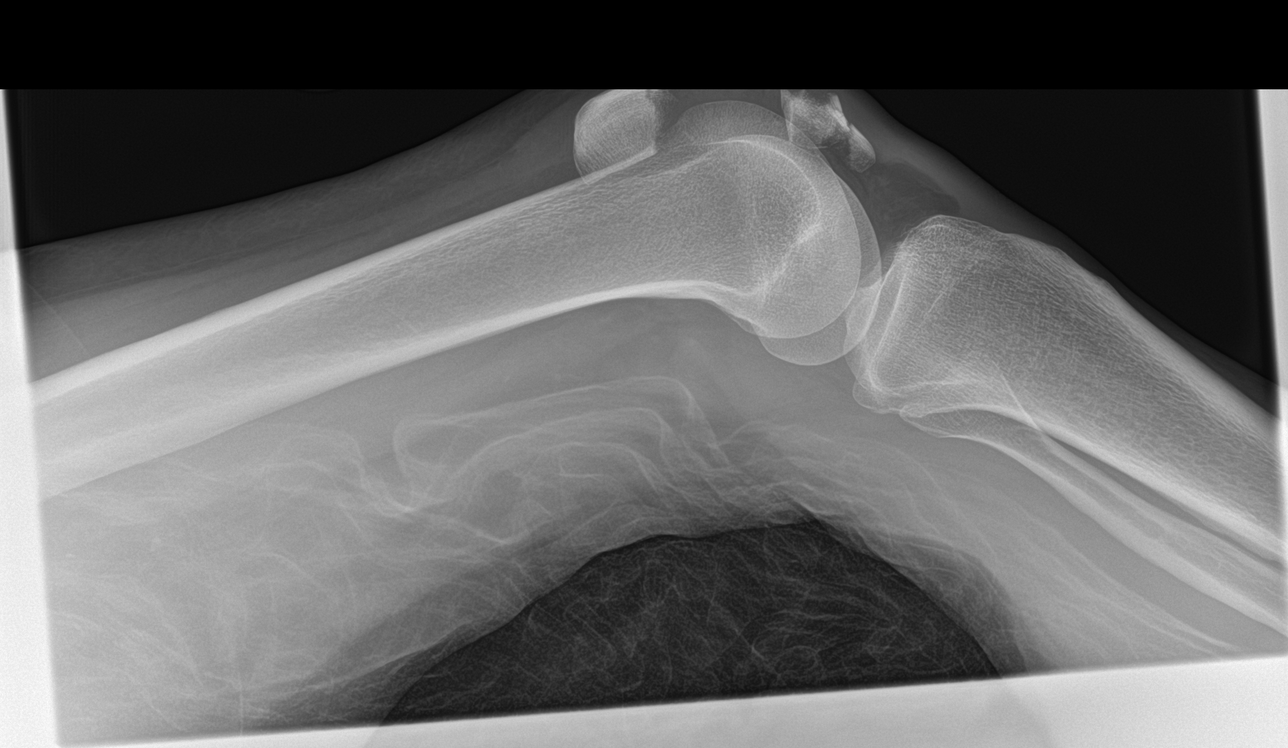

[knee tunnel]
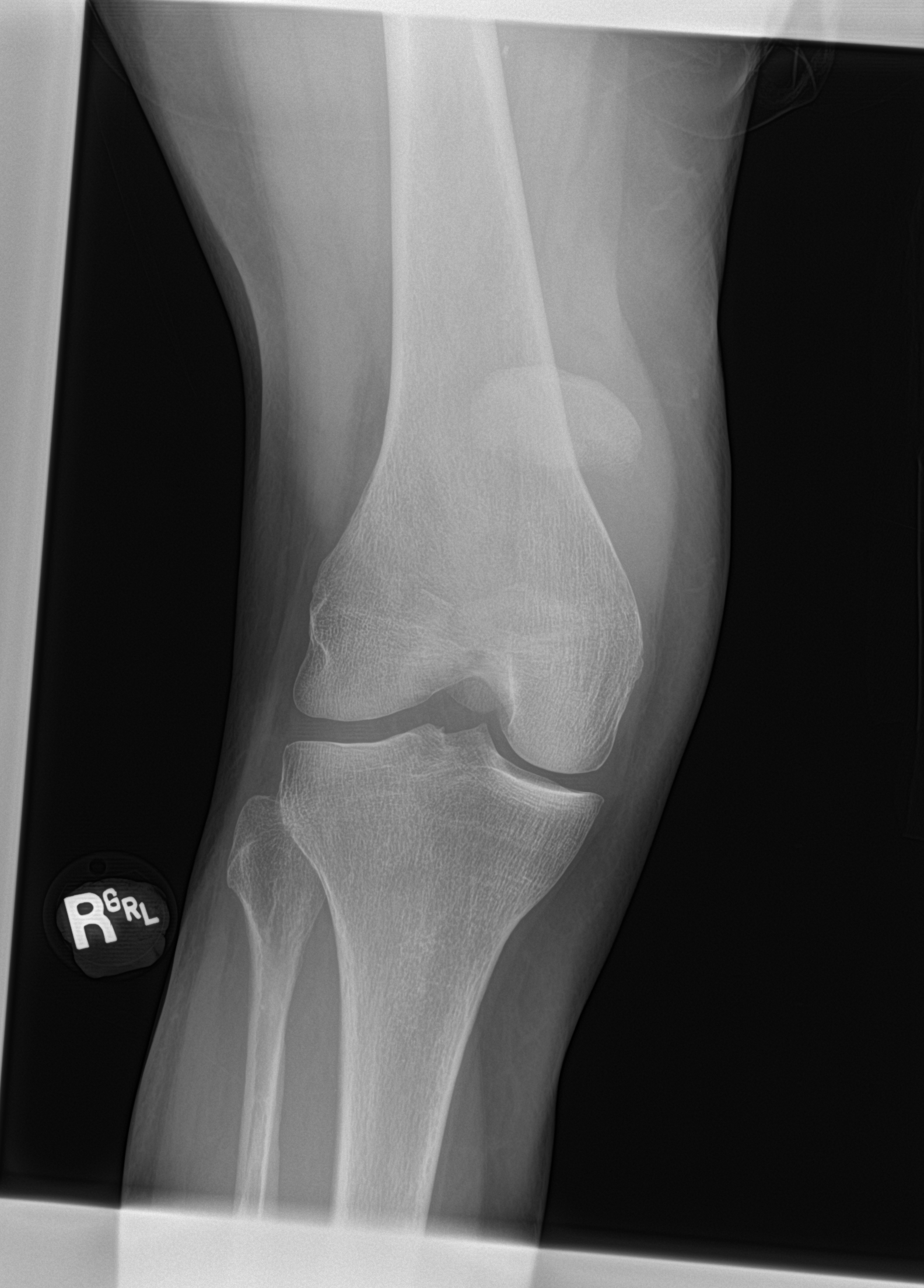

[knee lat]
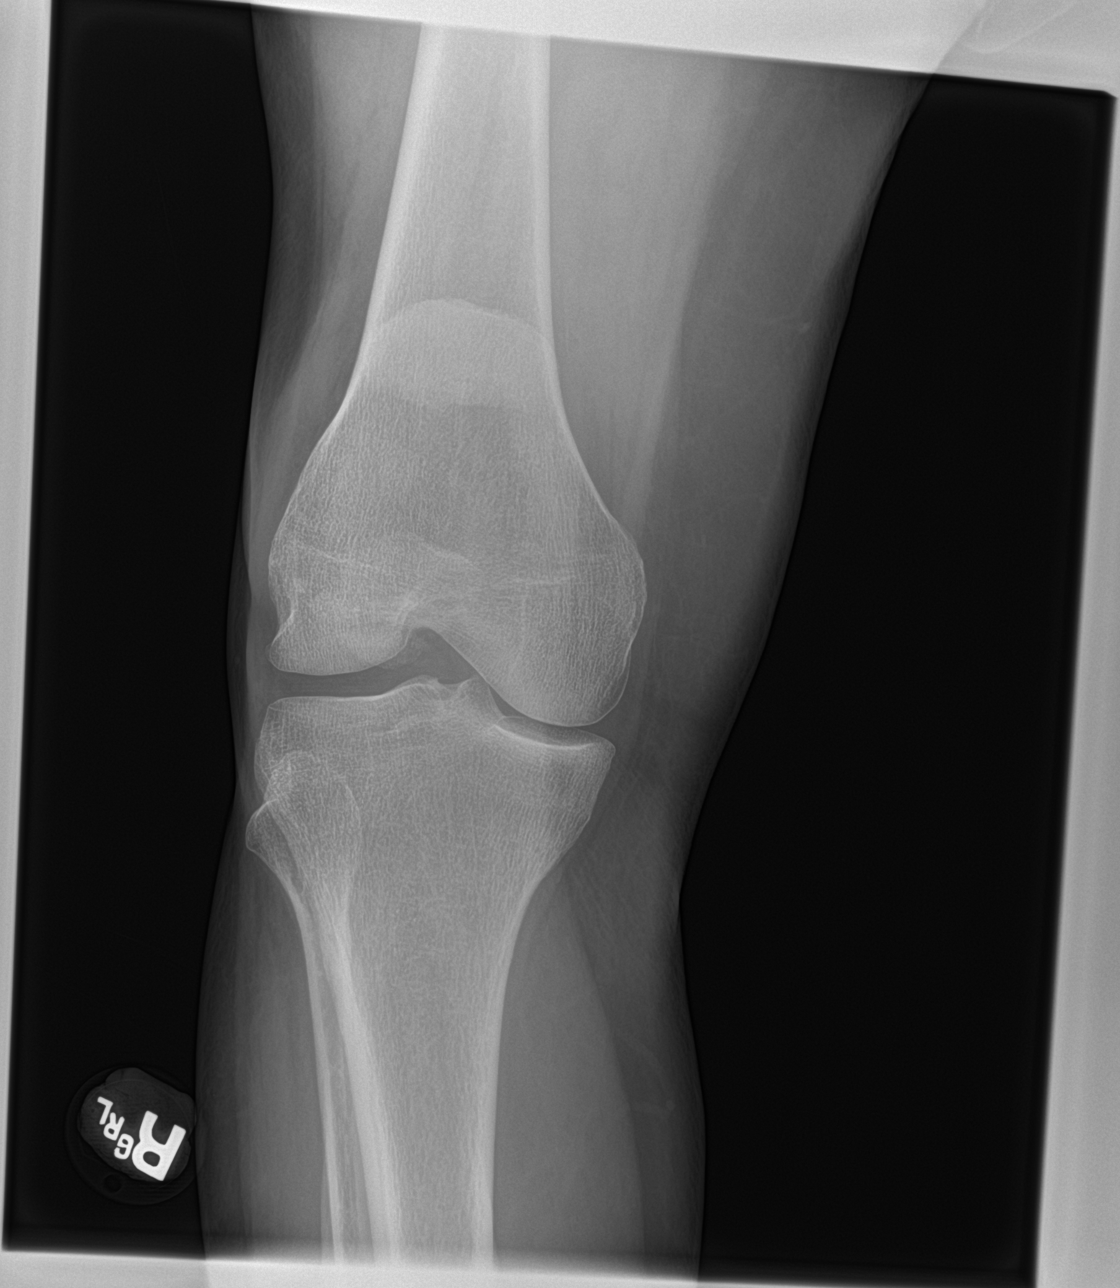

[knee obl]
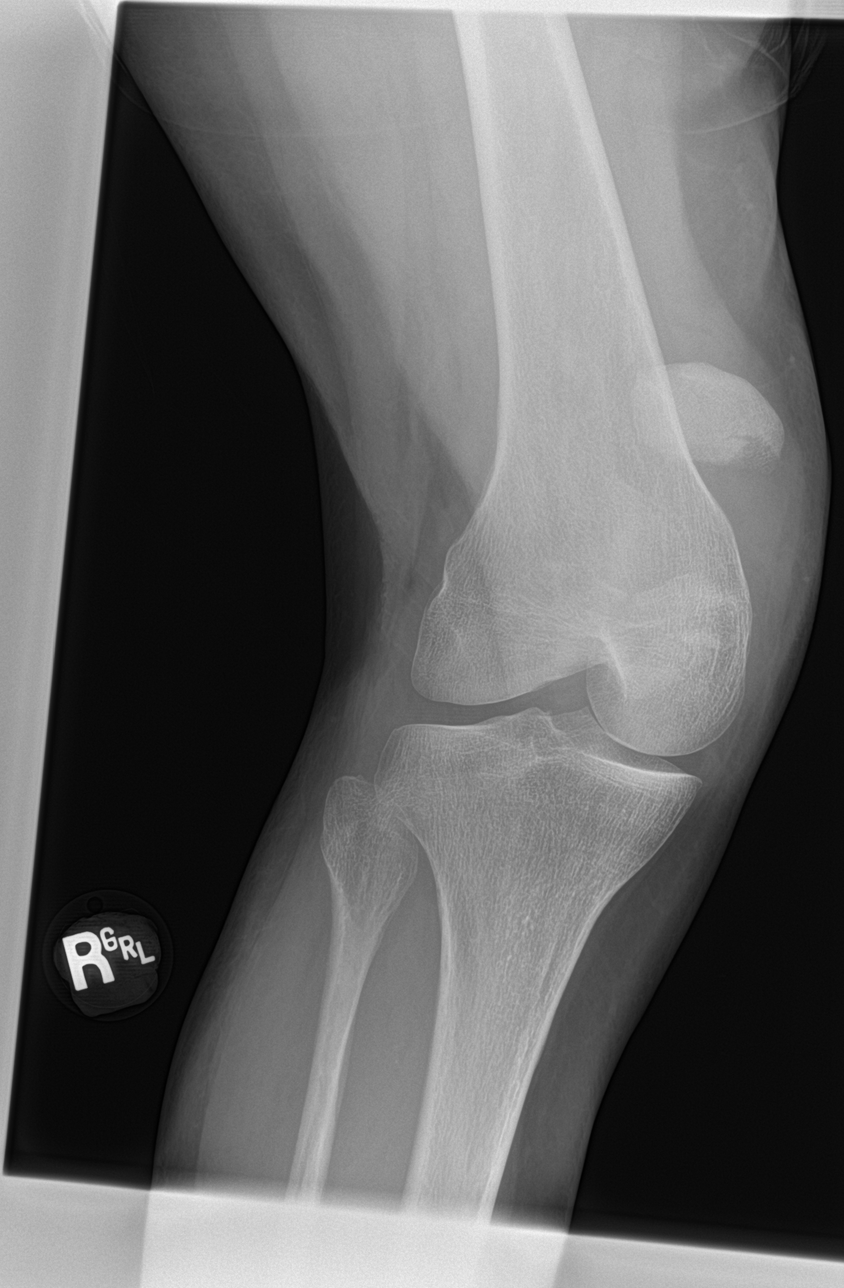

[4 of 4 positions shown; findings below may reference images not displayed]

FINDINGS: There is an acute transverse comminuted fracture extending through
the mid pole of the patella. Superior aspect of the talus subluxed
superiorly. Overlying soft tissue swelling. Distal femur and
proximal tibia intact.
IMPRESSION: Acute comminuted transverse patellar fracture.

## 2018-03-31 MED ORDER — CEFAZOLIN SODIUM-DEXTROSE 2-4 GM/100ML-% IV SOLN
2.0000 g | INTRAVENOUS | Status: AC
  Administered 2018-04-01: 2 g via INTRAVENOUS

## 2018-04-01 ENCOUNTER — Encounter
Admission: RE | Disposition: A | Discharge status: Home / Self Care | Payer: Self-pay | Source: Ambulatory Visit | Attending: Orthopedic Surgery

## 2018-04-01 ENCOUNTER — Ambulatory Visit
Admission: RE | Admit: 2018-04-01 | Discharge: 2018-04-01 | Disposition: A | Discharge status: Home / Self Care | Payer: No Typology Code available for payment source | Source: Ambulatory Visit | Attending: Orthopedic Surgery | Admitting: Orthopedic Surgery

## 2018-04-01 ENCOUNTER — Ambulatory Visit: Payer: No Typology Code available for payment source

## 2018-04-01 ENCOUNTER — Other Ambulatory Visit: Payer: Self-pay

## 2018-04-01 ENCOUNTER — Ambulatory Visit: Payer: No Typology Code available for payment source | Admitting: Anesthesiology

## 2018-04-01 DIAGNOSIS — Z79899 Other long term (current) drug therapy: Secondary | ICD-10-CM | POA: Diagnosis not present

## 2018-04-01 DIAGNOSIS — M24661 Ankylosis, right knee: Secondary | ICD-10-CM | POA: Insufficient documentation

## 2018-04-01 DIAGNOSIS — Z9842 Cataract extraction status, left eye: Secondary | ICD-10-CM | POA: Diagnosis not present

## 2018-04-01 DIAGNOSIS — M810 Age-related osteoporosis without current pathological fracture: Secondary | ICD-10-CM | POA: Diagnosis not present

## 2018-04-01 DIAGNOSIS — Z801 Family history of malignant neoplasm of trachea, bronchus and lung: Secondary | ICD-10-CM | POA: Diagnosis not present

## 2018-04-01 DIAGNOSIS — M2241 Chondromalacia patellae, right knee: Secondary | ICD-10-CM | POA: Insufficient documentation

## 2018-04-01 DIAGNOSIS — I10 Essential (primary) hypertension: Secondary | ICD-10-CM | POA: Diagnosis not present

## 2018-04-01 DIAGNOSIS — Z8041 Family history of malignant neoplasm of ovary: Secondary | ICD-10-CM | POA: Diagnosis not present

## 2018-04-01 DIAGNOSIS — Z803 Family history of malignant neoplasm of breast: Secondary | ICD-10-CM | POA: Diagnosis not present

## 2018-04-01 DIAGNOSIS — X58XXXA Exposure to other specified factors, initial encounter: Secondary | ICD-10-CM | POA: Diagnosis not present

## 2018-04-01 DIAGNOSIS — D72819 Decreased white blood cell count, unspecified: Secondary | ICD-10-CM | POA: Insufficient documentation

## 2018-04-01 DIAGNOSIS — D649 Anemia, unspecified: Secondary | ICD-10-CM | POA: Diagnosis not present

## 2018-04-01 DIAGNOSIS — M94261 Chondromalacia, right knee: Secondary | ICD-10-CM | POA: Diagnosis not present

## 2018-04-01 DIAGNOSIS — Z7982 Long term (current) use of aspirin: Secondary | ICD-10-CM | POA: Insufficient documentation

## 2018-04-01 DIAGNOSIS — Z9841 Cataract extraction status, right eye: Secondary | ICD-10-CM | POA: Diagnosis not present

## 2018-04-01 DIAGNOSIS — Z9889 Other specified postprocedural states: Secondary | ICD-10-CM | POA: Diagnosis not present

## 2018-04-01 DIAGNOSIS — R748 Abnormal levels of other serum enzymes: Secondary | ICD-10-CM | POA: Insufficient documentation

## 2018-04-01 DIAGNOSIS — E785 Hyperlipidemia, unspecified: Secondary | ICD-10-CM | POA: Diagnosis not present

## 2018-04-01 DIAGNOSIS — T8484XA Pain due to internal orthopedic prosthetic devices, implants and grafts, initial encounter: Secondary | ICD-10-CM | POA: Diagnosis not present

## 2018-04-01 DIAGNOSIS — J309 Allergic rhinitis, unspecified: Secondary | ICD-10-CM | POA: Diagnosis not present

## 2018-04-01 DIAGNOSIS — S83231A Complex tear of medial meniscus, current injury, right knee, initial encounter: Secondary | ICD-10-CM | POA: Insufficient documentation

## 2018-04-01 DIAGNOSIS — G510 Bell's palsy: Secondary | ICD-10-CM | POA: Insufficient documentation

## 2018-04-01 DIAGNOSIS — Z888 Allergy status to other drugs, medicaments and biological substances status: Secondary | ICD-10-CM | POA: Diagnosis not present

## 2018-04-01 DIAGNOSIS — K219 Gastro-esophageal reflux disease without esophagitis: Secondary | ICD-10-CM | POA: Diagnosis not present

## 2018-04-01 DIAGNOSIS — Z800 Family history of malignant neoplasm of digestive organs: Secondary | ICD-10-CM | POA: Insufficient documentation

## 2018-04-01 HISTORY — PX: KNEE CLOSED REDUCTION: SHX995

## 2018-04-01 HISTORY — PX: HARDWARE REMOVAL: SHX979

## 2018-04-01 HISTORY — PX: KNEE ARTHROSCOPY: SHX127

## 2018-04-01 SURGERY — ARTHROSCOPY, KNEE
Anesthesia: General | Site: Knee | Laterality: Right | Wound class: Clean

## 2018-04-01 MED ORDER — PROPOFOL 10 MG/ML IV BOLUS
INTRAVENOUS | Status: AC
  Filled 2018-04-01: qty 20

## 2018-04-01 MED ORDER — LACTATED RINGERS IV SOLN
INTRAVENOUS | Status: DC
  Administered 2018-04-01: 08:00:00 via INTRAVENOUS
  Administered 2018-04-01: 1000 mL via INTRAVENOUS

## 2018-04-01 MED ORDER — LIDOCAINE HCL 1 % IJ SOLN
INTRAMUSCULAR | Status: DC | PRN
  Administered 2018-04-01: 6 mL

## 2018-04-01 MED ORDER — LIDOCAINE HCL (PF) 2 % IJ SOLN
INTRAMUSCULAR | Status: AC
  Filled 2018-04-01: qty 10

## 2018-04-01 MED ORDER — BUPIVACAINE-EPINEPHRINE (PF) 0.25% -1:200000 IJ SOLN
INTRAMUSCULAR | Status: AC
  Filled 2018-04-01: qty 30

## 2018-04-01 MED ORDER — ONDANSETRON HCL 4 MG/2ML IJ SOLN
INTRAMUSCULAR | Status: DC | PRN
  Administered 2018-04-01: 4 mg via INTRAVENOUS

## 2018-04-01 MED ORDER — GLYCOPYRROLATE 0.2 MG/ML IJ SOLN
INTRAMUSCULAR | Status: DC | PRN
  Administered 2018-04-01: .2 mg via INTRAVENOUS

## 2018-04-01 MED ORDER — ACETAMINOPHEN 10 MG/ML IV SOLN
INTRAVENOUS | Status: DC | PRN
  Administered 2018-04-01: 1000 mg via INTRAVENOUS

## 2018-04-01 MED ORDER — OXYCODONE HCL 5 MG PO TABS
5.0000 mg | ORAL_TABLET | ORAL | Status: DC | PRN
  Administered 2018-04-01: 5 mg via ORAL

## 2018-04-01 MED ORDER — NEOMYCIN-POLYMYXIN B GU 40-200000 IR SOLN
Status: DC | PRN
  Administered 2018-04-01: 4 mL

## 2018-04-01 MED ORDER — LIDOCAINE HCL (CARDIAC) PF 100 MG/5ML IV SOSY
PREFILLED_SYRINGE | INTRAVENOUS | Status: DC | PRN
  Administered 2018-04-01: 60 mg via INTRAVENOUS

## 2018-04-01 MED ORDER — OXYCODONE HCL 5 MG PO TABS
ORAL_TABLET | ORAL | Status: AC
  Filled 2018-04-01: qty 1

## 2018-04-01 MED ORDER — FENTANYL CITRATE (PF) 100 MCG/2ML IJ SOLN
INTRAMUSCULAR | Status: AC
  Filled 2018-04-01: qty 2

## 2018-04-01 MED ORDER — LIDOCAINE HCL (PF) 1 % IJ SOLN
INTRAMUSCULAR | Status: AC
  Filled 2018-04-01: qty 30

## 2018-04-01 MED ORDER — OXYCODONE HCL 5 MG PO TABS: 5.0000 mg | ORAL_TABLET | ORAL | 0 refills | Status: DC | PRN

## 2018-04-01 MED ORDER — ONDANSETRON HCL 4 MG PO TABS: 4.0000 mg | ORAL_TABLET | Freq: Three times a day (TID) | ORAL | 0 refills | Status: DC | PRN

## 2018-04-01 MED ORDER — NEOMYCIN-POLYMYXIN B GU 40-200000 IR SOLN
Status: AC
  Filled 2018-04-01: qty 2

## 2018-04-01 MED ORDER — ONDANSETRON HCL 4 MG/2ML IJ SOLN: 4.0000 mg | Freq: Once | INTRAMUSCULAR | Status: DC | PRN

## 2018-04-01 MED ORDER — CEFAZOLIN SODIUM-DEXTROSE 2-4 GM/100ML-% IV SOLN
INTRAVENOUS | Status: AC
  Filled 2018-04-01: qty 100

## 2018-04-01 MED ORDER — ACETAMINOPHEN 10 MG/ML IV SOLN
INTRAVENOUS | Status: AC
  Filled 2018-04-01: qty 100

## 2018-04-01 MED ORDER — CHLORHEXIDINE GLUCONATE CLOTH 2 % EX PADS: 6.0000 | MEDICATED_PAD | Freq: Once | CUTANEOUS | Status: DC

## 2018-04-01 MED ORDER — BUPIVACAINE-EPINEPHRINE (PF) 0.25% -1:200000 IJ SOLN
INTRAMUSCULAR | Status: DC | PRN
  Administered 2018-04-01: 30 mL

## 2018-04-01 MED ORDER — MIDAZOLAM HCL 2 MG/2ML IJ SOLN
INTRAMUSCULAR | Status: AC
  Filled 2018-04-01: qty 2

## 2018-04-01 MED ORDER — MIDAZOLAM HCL 2 MG/2ML IJ SOLN
INTRAMUSCULAR | Status: DC | PRN
  Administered 2018-04-01: 1 mg via INTRAVENOUS

## 2018-04-01 MED ORDER — FENTANYL CITRATE (PF) 100 MCG/2ML IJ SOLN
INTRAMUSCULAR | Status: AC
  Administered 2018-04-01: 25 ug via INTRAVENOUS
  Filled 2018-04-01: qty 2

## 2018-04-01 MED ORDER — FENTANYL CITRATE (PF) 100 MCG/2ML IJ SOLN
25.0000 ug | INTRAMUSCULAR | Status: DC | PRN
  Administered 2018-04-01 (×2): 25 ug via INTRAVENOUS

## 2018-04-01 MED ORDER — PHENYLEPHRINE HCL 10 MG/ML IJ SOLN
INTRAMUSCULAR | Status: DC | PRN
  Administered 2018-04-01 (×2): 50 ug via INTRAVENOUS

## 2018-04-01 MED ORDER — EPHEDRINE SULFATE 50 MG/ML IJ SOLN
INTRAMUSCULAR | Status: AC
  Filled 2018-04-01: qty 1

## 2018-04-01 MED ORDER — DEXAMETHASONE SODIUM PHOSPHATE 10 MG/ML IJ SOLN
INTRAMUSCULAR | Status: DC | PRN
  Administered 2018-04-01: 8 mg via INTRAVENOUS

## 2018-04-01 MED ORDER — ASPIRIN EC 325 MG PO TBEC: 325.0000 mg | DELAYED_RELEASE_TABLET | Freq: Every day | ORAL | 0 refills | Status: AC

## 2018-04-01 MED ORDER — PROPOFOL 10 MG/ML IV BOLUS
INTRAVENOUS | Status: DC | PRN
  Administered 2018-04-01: 130 mg via INTRAVENOUS

## 2018-04-01 MED ORDER — EPHEDRINE SULFATE 50 MG/ML IJ SOLN
INTRAMUSCULAR | Status: DC | PRN
  Administered 2018-04-01 (×2): 10 mg via INTRAVENOUS

## 2018-04-01 MED ORDER — FENTANYL CITRATE (PF) 100 MCG/2ML IJ SOLN
INTRAMUSCULAR | Status: DC | PRN
  Administered 2018-04-01 (×2): 50 ug via INTRAVENOUS

## 2018-04-01 MED ORDER — BUPIVACAINE HCL (PF) 0.5 % IJ SOLN
INTRAMUSCULAR | Status: AC
  Filled 2018-04-01: qty 30

## 2018-04-01 SURGICAL SUPPLY — 68 items
ADAPTER IRRIG TUBE 2 SPIKE SOL (ADAPTER) ×4 IMPLANT
BANDAGE ELASTIC 6 LF NS (GAUZE/BANDAGES/DRESSINGS) ×2 IMPLANT
BLADE SURG 15 STRL LF DISP TIS (BLADE) ×2 IMPLANT
BLADE SURG 15 STRL SS (BLADE) ×2
BLADE SURG SZ11 CARB STEEL (BLADE) ×2 IMPLANT
BNDG ESMARK 6X12 TAN STRL LF (GAUZE/BANDAGES/DRESSINGS) ×2 IMPLANT
BUR RADIUS 3.5 (BURR) ×2 IMPLANT
BUR RADIUS 4.0X18.5 (BURR) ×2 IMPLANT
CANISTER SUCT 1200ML W/VALVE (MISCELLANEOUS) ×2 IMPLANT
CANISTER SUCT LVC 12 LTR MEDI- (MISCELLANEOUS) IMPLANT
COOLER POLAR GLACIER W/PUMP (MISCELLANEOUS) IMPLANT
CUFF TOURN 18 STER (MISCELLANEOUS) IMPLANT
CUFF TOURN 24 STER (MISCELLANEOUS) ×2 IMPLANT
CUFF TOURN 30 STER DUAL PORT (MISCELLANEOUS) IMPLANT
DEVICE SUCT BLK HOLE OR FLOOR (MISCELLANEOUS) ×2 IMPLANT
DRAPE FLUOR MINI C-ARM 54X84 (DRAPES) ×2 IMPLANT
DRAPE IMP U-DRAPE 54X76 (DRAPES) ×2 IMPLANT
DRAPE INCISE IOBAN 66X45 STRL (DRAPES) ×2 IMPLANT
DRAPE U-SHAPE 47X51 STRL (DRAPES) ×2 IMPLANT
DURAPREP 26ML APPLICATOR (WOUND CARE) ×4 IMPLANT
ELECT REM PT RETURN 9FT ADLT (ELECTROSURGICAL) ×2
ELECTRODE REM PT RTRN 9FT ADLT (ELECTROSURGICAL) ×1 IMPLANT
GAUZE PETRO XEROFOAM 1X8 (MISCELLANEOUS) ×2 IMPLANT
GAUZE SPONGE 4X4 12PLY STRL (GAUZE/BANDAGES/DRESSINGS) ×2 IMPLANT
GLOVE BIOGEL PI IND STRL 7.5 (GLOVE) ×4 IMPLANT
GLOVE BIOGEL PI IND STRL 9 (GLOVE) ×1 IMPLANT
GLOVE BIOGEL PI INDICATOR 7.5 (GLOVE) ×4
GLOVE BIOGEL PI INDICATOR 9 (GLOVE) ×1
GLOVE SURG 9.0 ORTHO LTXF (GLOVE) ×4 IMPLANT
GOWN STRL REUS TWL 2XL XL LVL4 (GOWN DISPOSABLE) ×2 IMPLANT
GOWN STRL REUS W/ TWL LRG LVL3 (GOWN DISPOSABLE) ×1 IMPLANT
GOWN STRL REUS W/TWL LRG LVL3 (GOWN DISPOSABLE) ×1
IV LACTATED RINGER IRRG 3000ML (IV SOLUTION) ×10
IV LR IRRIG 3000ML ARTHROMATIC (IV SOLUTION) ×10 IMPLANT
KIT TURNOVER KIT A (KITS) ×2 IMPLANT
LABEL OR SOLS (LABEL) ×2 IMPLANT
MANIFOLD NEPTUNE II (INSTRUMENTS) ×2 IMPLANT
MAT BLUE FLOOR 46X72 FLO (MISCELLANEOUS) ×2 IMPLANT
NEEDLE HYPO 22GX1.5 SAFETY (NEEDLE) ×4 IMPLANT
NS IRRIG 1000ML POUR BTL (IV SOLUTION) ×2 IMPLANT
PACK ARTHROSCOPY KNEE (MISCELLANEOUS) ×2 IMPLANT
PACK EXTREMITY ARMC (MISCELLANEOUS) ×2 IMPLANT
PAD ABD DERMACEA PRESS 5X9 (GAUZE/BANDAGES/DRESSINGS) ×4 IMPLANT
PAD CAST CTTN 4X4 STRL (SOFTGOODS) ×1 IMPLANT
PAD WRAPON POLAR KNEE (MISCELLANEOUS) ×1 IMPLANT
PADDING CAST COTTON 4X4 STRL (SOFTGOODS) ×1
PENCIL ELECTRO HAND CTR (MISCELLANEOUS) ×2 IMPLANT
SET TUBE SUCT SHAVER OUTFL 24K (TUBING) ×2 IMPLANT
SET TUBE TIP INTRA-ARTICULAR (MISCELLANEOUS) ×2 IMPLANT
SOL PREP PVP 2OZ (MISCELLANEOUS)
SOLUTION PREP PVP 2OZ (MISCELLANEOUS) IMPLANT
SPLINT CAST 1 STEP 4X30 (MISCELLANEOUS) ×2 IMPLANT
SPONGE LAP 18X18 5 PK (GAUZE/BANDAGES/DRESSINGS) ×2 IMPLANT
STAPLER SKIN PROX 35W (STAPLE) ×2 IMPLANT
STOCKINETTE STRL 6IN 960660 (GAUZE/BANDAGES/DRESSINGS) ×2 IMPLANT
STRIP CLOSURE SKIN 1/2X4 (GAUZE/BANDAGES/DRESSINGS) ×2 IMPLANT
SUT ETHILON 4-0 (SUTURE) ×1
SUT ETHILON 4-0 FS2 18XMFL BLK (SUTURE) ×1
SUT VIC AB 2-0 SH 27 (SUTURE) ×1
SUT VIC AB 2-0 SH 27XBRD (SUTURE) ×1 IMPLANT
SUTURE ETHLN 4-0 FS2 18XMF BLK (SUTURE) ×1 IMPLANT
SYR 30ML LL (SYRINGE) ×2 IMPLANT
SYR BULB IRRIG 60ML STRL (SYRINGE) ×2 IMPLANT
TAPE MICROPORE 2IN (TAPE) ×2 IMPLANT
TUBING ARTHRO INFLOW-ONLY STRL (TUBING) ×2 IMPLANT
WAND HAND CNTRL MULTIVAC 50 (MISCELLANEOUS) IMPLANT
WAND HAND CNTRL MULTIVAC 90 (MISCELLANEOUS) ×2 IMPLANT
WRAPON POLAR PAD KNEE (MISCELLANEOUS) ×2

## 2018-04-01 NOTE — Op Note (Signed)
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  CLOSED MANIPULATION OF RIGHT KNEE UNDER ANESTHESIA 2.  RIGHT KNEE ARTHROSCOPIC LYSIS OF ADHESIONS, Partial MEDIAL MENISECTOMY, CHONDROPLASTY OF THE UNDERSURFACE OF THE PATELLA AND MEDIAL FEMORAL CONDYLE 3.  REMOVAL OF HARDWARE FROM RIGHT KNEE  SURGEON:  Thornton Park, MD  ANESTHESIA:   General  PREOPERATIVE INDICATIONS:  Shelly Heath  77 y.o. female with a diagnosis of arthrofibrosis of right knee and painful hardware who failed conservative management and elected for surgical management.    The risks benefits and alternatives were discussed with the patient preoperatively including the risks of infection, bleeding, nerve injury, knee stiffness, persistent pain, osteoarthritis and the need for further surgery. Medical  risks include DVT and pulmonary embolism, myocardial infarction, stroke, pneumonia, respiratory failure and death. The patient understood these risks and wished to proceed.  OPERATIVE FINDINGS: Complex tear of the medial meniscus, peripatellar adhesions in right knee, complex tear of medial meniscus, grade II/III chondral wear of the medial femoral condyle  OPERATIVE PROCEDURE: Patient was met in the preoperative area. The operative extremity was signed with the word yes and my initials according the hospital's correct site of surgery protocol.  The patient was brought to the operating room where they was placed supine on the operative table. General anesthesia was administered. The patient was prepped and draped in a sterile fashion.  A timeout was performed to verify the patient's name, date of birth, medical record number, correct site of surgery correct procedure to be performed. It was also used to  verify the patient received antibiotics that all appropriate instruments, and radiographic studies were available in the room. Once all in attendance were in agreement, the case began.  Closed manipulation under anesthesia was performed.  Audible popping of scar tissue breaking up was heard.  Patient achieved 0 to 120 degrees after manipulation.  Her starting range of motion was from 0 to 95 degrees.  The arthroscopic portion of the case then began.  Proposed arthroscopy incisions were drawn out with a surgical marker. These were pre-injected with 1% lidocaine plain. An 11 blade was used to establish an inferior lateral and inferomedial portals. The inferomedial portal was created using a 18-gauge spinal needle under direct visualization.  A full diagnostic examination of the knee was performed including the suprapatellar pouch, patellofemoral joint, medial lateral compartments as well as the medial lateral gutters, the intercondylar notch in the posterior knee.  Patient had lysis of adhesions within the medial and lateral gutters, anterior knee in the suprapatellar pouch using 4.0 mm resector shaver blade and 90 degree ArthroCare wand.  A medial and lateral release were also performed to improve patellar mobility  Patient had the meniscal tear involving the posterior horn which was treated with a 4.0 resector shaver blade. The meniscus was debrided until a stable rim was achieved. A chondroplasty of the medial femoral condyle and undersurface of patella was also performed using a 4-0 resector shaver blade.  The knee was then copiously lavaged. All arthroscopic instruments were removed.   The open portion of the procedure began.  A 3 to 4 cm midline incision through the previous surgical scar was made.  Thickness skin flaps were mobilized.  The patella wires were then identified overlying scar tissue was removed.  The wire was  then cut.  The 2 ends of the wire were then straightened out.  A  cannulated T-handled screwdriver was then placed over the wire and down to the inferior pole of the patella until it engaged the bottom of the screw fixing the patella.  The screws were then backed out by hand.  This procedure was repeated for the second screw.  FiberWire suture that had been used to repair the retinaculum were also removed.   The knots from these sutures were palpable under the skin as prior to surgery.  Fluoroscan images were taken in the OR to ensure there was no retained hardware and no re-fracture to the patella.  The wound was then copes irrigated.  The midline incision was closed with 2-0 Vicryl and the skin closed with staples.  The 2 arthroscopy portals were closed with 4-0 nylon. 0.25% marcaine with epinephrine was injected intra-articularly to assist with post-op analgesia.  Steri-Strips were applied over the portal incisions.  A dry sterile and compressive dressing over the incisions. The patient was brought to the PACU in stable condition. I was scrubbed and present for the entire case and all sharp and instrument counts were correct at the conclusion the case. I spoke with the patient's husband postoperatively to let him know the case was performed without complication and the patient was stable in the recovery room.      Timoteo Gaul, MD

## 2018-04-01 NOTE — Transfer of Care (Signed)
Immediate Anesthesia Transfer of Care Note  Patient: Shelly Heath  Procedure(s) Performed: ARTHROSCOPY KNEE,PARTIAL MEDIAL MENISCECTOMY, CHONDROPLASTY (Right Knee) HARDWARE REMOVAL (Right Knee) CLOSED MANIPULATION KNEE UNDER ANESTHESIA (Right Knee)  Patient Location: PACU  Anesthesia Type:General  Level of Consciousness: sedated  Airway & Oxygen Therapy: Patient Spontanous Breathing and Patient connected to face mask oxygen  Post-op Assessment: Report given to RN and Post -op Vital signs reviewed and stable  Post vital signs: Reviewed and stable  Last Vitals:  Vitals Value Taken Time  BP 137/56 04/01/2018 10:39 AM  Temp    Pulse    Resp 11 04/01/2018 10:41 AM  SpO2    Vitals shown include unvalidated device data.  Last Pain:  Vitals:   04/01/18 0618  TempSrc: Tympanic         Complications: No apparent anesthesia complications

## 2018-04-01 NOTE — Discharge Instructions (Signed)

## 2018-04-01 NOTE — OR Nursing (Signed)
Ambulatory to BR with walker, back to room, tolerated well.

## 2018-04-01 NOTE — Anesthesia Post-op Follow-up Note (Signed)
Anesthesia QCDR form completed.        

## 2018-04-01 NOTE — Anesthesia Procedure Notes (Signed)
Procedure Name: LMA Insertion Date/Time: 04/01/2018 8:10 AM Performed by: Henrietta Hoover, CRNA Pre-anesthesia Checklist: Patient identified, Emergency Drugs available, Suction available, Patient being monitored and Timeout performed Patient Re-evaluated:Patient Re-evaluated prior to induction Preoxygenation: Pre-oxygenation with 100% oxygen Induction Type: IV induction Ventilation: Mask ventilation without difficulty LMA: LMA inserted LMA Size: 4.0 Number of attempts: 1 Tube secured with: Tape Dental Injury: Teeth and Oropharynx as per pre-operative assessment

## 2018-04-01 NOTE — Anesthesia Preprocedure Evaluation (Signed)
Anesthesia Evaluation  Patient identified by MRN, date of birth, ID band Patient awake    Reviewed: Allergy & Precautions, NPO status , Patient's Chart, lab work & pertinent test results, reviewed documented beta blocker date and time   Airway Mallampati: II  TM Distance: >3 FB     Dental  (+) Chipped   Pulmonary pneumonia, resolved,           Cardiovascular hypertension, Pt. on medications      Neuro/Psych  Neuromuscular disease    GI/Hepatic GERD  Controlled,  Endo/Other    Renal/GU      Musculoskeletal   Abdominal   Peds  Hematology  (+) anemia ,   Anesthesia Other Findings   Reproductive/Obstetrics                             Anesthesia Physical Anesthesia Plan  ASA: III  Anesthesia Plan: General   Post-op Pain Management:    Induction: Intravenous  PONV Risk Score and Plan:   Airway Management Planned: LMA  Additional Equipment:   Intra-op Plan:   Post-operative Plan:   Informed Consent: I have reviewed the patients History and Physical, chart, labs and discussed the procedure including the risks, benefits and alternatives for the proposed anesthesia with the patient or authorized representative who has indicated his/her understanding and acceptance.     Plan Discussed with: CRNA  Anesthesia Plan Comments:         Anesthesia Quick Evaluation

## 2018-04-01 NOTE — OR Nursing (Signed)
Explanted two screws and wire from right knee. Explanted implants placed in biohazard bin per MD order.

## 2018-04-01 NOTE — Anesthesia Postprocedure Evaluation (Signed)
Anesthesia Post Note  Patient: CHANAH TIDMORE  Procedure(s) Performed: ARTHROSCOPY KNEE,PARTIAL MEDIAL MENISCECTOMY, CHONDROPLASTY (Right Knee) HARDWARE REMOVAL (Right Knee) CLOSED MANIPULATION KNEE UNDER ANESTHESIA (Right Knee)  Patient location during evaluation: PACU Anesthesia Type: General Level of consciousness: awake and alert Pain management: pain level controlled Vital Signs Assessment: post-procedure vital signs reviewed and stable Respiratory status: spontaneous breathing, nonlabored ventilation, respiratory function stable and patient connected to nasal cannula oxygen Cardiovascular status: blood pressure returned to baseline and stable Postop Assessment: no apparent nausea or vomiting Anesthetic complications: no     Last Vitals:  Vitals:   04/01/18 1220 04/01/18 1319  BP: (!) 164/87 (!) 177/58  Pulse: 79 72  Resp: 16 16  Temp: 36.5 C (!) 36.3 C  SpO2: 97% 97%    Last Pain:  Vitals:   04/01/18 1319  TempSrc: Temporal  PainSc: 1                  Haeli Gerlich S

## 2018-04-01 NOTE — H&P (Signed)
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 female who presents for preoperative history and DARRYL BLUMENSTEINith a diagnosis of arthro-fibrosis right knee is post ORIF of right patella fracture on 02/28/17. Symptoms are limiting patient's ADLs including being able to walk downstairs with alternating feet.  This is significantly impairing activities of daily living.  She has elected for surgical management to improve knee range of motion.   Past Medical History:  Diagnosis Date  . Allergic rhinitis   . Anemia   . Bell's palsy   . Elevated liver enzymes   . GERD (gastroesophageal reflux disease)   . Hyperlipidemia   . Hypertension   . Leukopenia   . Osteoporosis, post-menopausal   . Pneumonia    Past Surgical History:  Procedure Laterality Date  . BROW LIFT Bilateral 10/16/2016   Procedure: BLEPHAROPLASTY;  Surgeon: Imagene Riches, MD;  Location: [facility];  Service: Ophthalmology;  Laterality: Bilateral;  . CATARACT EXTRACTION W/PHACO Left 04/24/2016   Procedure: CATARACT EXTRACTION PHACO AND INTRAOCULAR LENS PLACEMENT (IOC);  Surgeon: Galen Manila, MD;  Location: [facility];  Service: Ophthalmology;  Laterality: Left;  [place] 50.9 AP% 16.3 CDE 8.32 Fluid Pack Lot # [id number]  . CATARACT EXTRACTION W/PHACO Right 05/15/2016   Procedure: CATARACT EXTRACTION PHACO AND INTRAOCULAR LENS PLACEMENT (IOC);  Surgeon: Galen Manila, MD;  Location: [facility];  Service: Ophthalmology;  Laterality: Right;  [place] 00:43 AP% 18.8 CDE 8.19 fluid pack lot # [id number]  . COLONOSCOPY    . COLONOSCOPY N/A 05/07/2016   Procedure: COLONOSCOPY;  Surgeon: Scot Jun, MD;  Location: [facility];  Service: Endoscopy;  Laterality: N/A;  . NASAL FRACTURE SURGERY    . ORIF PATELLA Right 02/28/2017   Procedure: OPEN REDUCTION INTERNAL (ORIF) FIXATION PATELLA;  Surgeon: Juanell Fairly, MD;  Location: [facility];  Service:  Orthopedics;  Laterality: Right;  . PTOSIS REPAIR Bilateral 10/16/2016   Procedure: PTOSIS REPAIR;  Surgeon: Imagene Riches, MD;  Location: [facility];  Service: Ophthalmology;  Laterality: Bilateral;  . smr    . TONSILLECTOMY    . TUBAL LIGATION     Social History   Socioeconomic History  . Marital status: Married    Spouse name: Not on file  . Number of children: Not on file  . Years of education: Not on file  . Highest education level: Not on file  Occupational History  . Not on file  Social Needs  . Financial resource strain: Not on file  . Food insecurity:    Worry: Not on file    Inability: Not on file  . Transportation needs:    Medical: Not on file    Non-medical: Not on file  Tobacco Use  . Smoking status: Never Smoker  . Smokeless tobacco: Never Used  Substance and Sexual Activity  . Alcohol use: No  . Drug use: Never  . Sexual activity: Not on file  Lifestyle  . Physical activity:    Days per week: Not on file    Minutes per session: Not on file  . Stress: Not on file  Relationships  . Social connections:    Talks on phone: Not on file    Gets together: Not on file    Attends religious service: Not on file    Active member of club or organization: Not on file    Attends meetings of clubs or organizations: Not on  file    Relationship status: Not on file  Other Topics Concern  . Not on file  Social History Narrative  . Not on file   Family History  Problem Relation Age of Onset  . Lung cancer Mother   . Esophageal cancer Father   . Ovarian cancer Sister 48  . Breast cancer Sister    Allergies  Allergen Reactions  . Alendronate Other (See Comments)    Dyspepsia-indigestion  . Fish Oil Other (See Comments)    Dyspepsia-indigestion  . Other Nausea And Vomiting    liquid medications(Colonoscopy prep)  . Raloxifene Other (See Comments)    Elevated transaminases--liver enzymes  . Risedronate Other (See Comments)    Dyspepsia-indigestion    Prior to Admission medications   Medication Sig Start Date End Date Taking? Authorizing Provider  aspirin EC 81 MG tablet Take 81 mg by mouth at bedtime.   Yes [provider]  Calcium Carb-Cholecalciferol (CALCIUM 600+D3 PO) Take 1 tablet by mouth daily.   Yes [provider]  cetirizine (ZYRTEC) 10 MG tablet Take 10 mg by mouth daily as needed for allergies.    Yes [provider]  famotidine (PEPCID) 10 MG tablet Take 10 mg by mouth as needed for heartburn or indigestion.   Yes [provider]  losartan (COZAAR) 25 MG tablet Take 25 mg by mouth daily.   Yes [provider]  simvastatin (ZOCOR) 40 MG tablet Take 40 mg by mouth at bedtime.    Yes [provider]  vitamin B-12 (CYANOCOBALAMIN) 1000 MCG tablet Take 1,000 mcg by mouth daily.   Yes [provider]     Positive ROS: All other systems have been reviewed and were otherwise negative with the exception of those mentioned in the HPI and as above.  Physical Exam: General: Alert, no acute distress Cardiovascular: Regular rate and rhythm, no murmurs rubs or gallops.  No pedal edema Respiratory: Clear to auscultation bilaterally, no wheezes rales or rhonchi. No cyanosis, no use of accessory musculature GI: No organomegaly, abdomen is soft and non-tender nondistended with positive bowel sounds. Skin: Skin intact, no lesions within the operative field. Neurologic: Sensation intact distally Psychiatric: Patient is competent for consent with normal mood and affect Lymphatic: No cervical lymphadenopathy  MUSCULOSKELETAL: Right knee: Patient has range of motion from full extension to approximately 95 degrees of flexion.  Her incision is well healed.  She has mild tenderness over the anterior aspect of her patella.  The wires from her patella fixation are palpable and tender.  There is no erythema ecchymosis or effusion.  She has no calf tenderness or lower leg edema.  She is  intact sensation light touch, palpable pedal pulses and intact motor function.  Assessment: Arthrofibrosis of the right knee  Plan: Plan for Procedure(s): MANIPULATION UNDER ANESTHESIA ARTHROSCOPIC LYSIS OF ADHESIONS, RIGHT KNEE HARDWARE REMOVAL FROM RIGHT PATELLA  Discussed the details of the operation with the patient and her husband.  We discussed the postoperative course as well.  I discussed the risks and benefits of surgery. The risks include but are not limited to infection, bleeding requiring blood transfusion, nerve or blood vessel injury, joint stiffness or loss of motion, persistent pain, weakness or instability, malunion, nonunion and hardware failure and the need for further surgery. Medical risks include but are not limited to DVT and pulmonary embolism, myocardial infarction, stroke, pneumonia, respiratory failure and death. Patient understood these risks and wished to proceed.   Juanell Fairly, MD   04/01/2018  7:50 AM

## 2018-04-14 IMAGING — RF DG C-ARM 61-120 MIN
1 series · 3 of 3 positions shown · non-contrast
Comparison: Preoperative CT scan February 21, 2017 and right knee
series of February 11, 2017.

CLINICAL DATA: Status post right patellar fracture reduction.

EXAM:
RIGHT KNEE - 1-2 VIEW; DG C-ARM 61-120 MIN

[Series 1: run · 3 of 3 slices shown]
[im 1/3]
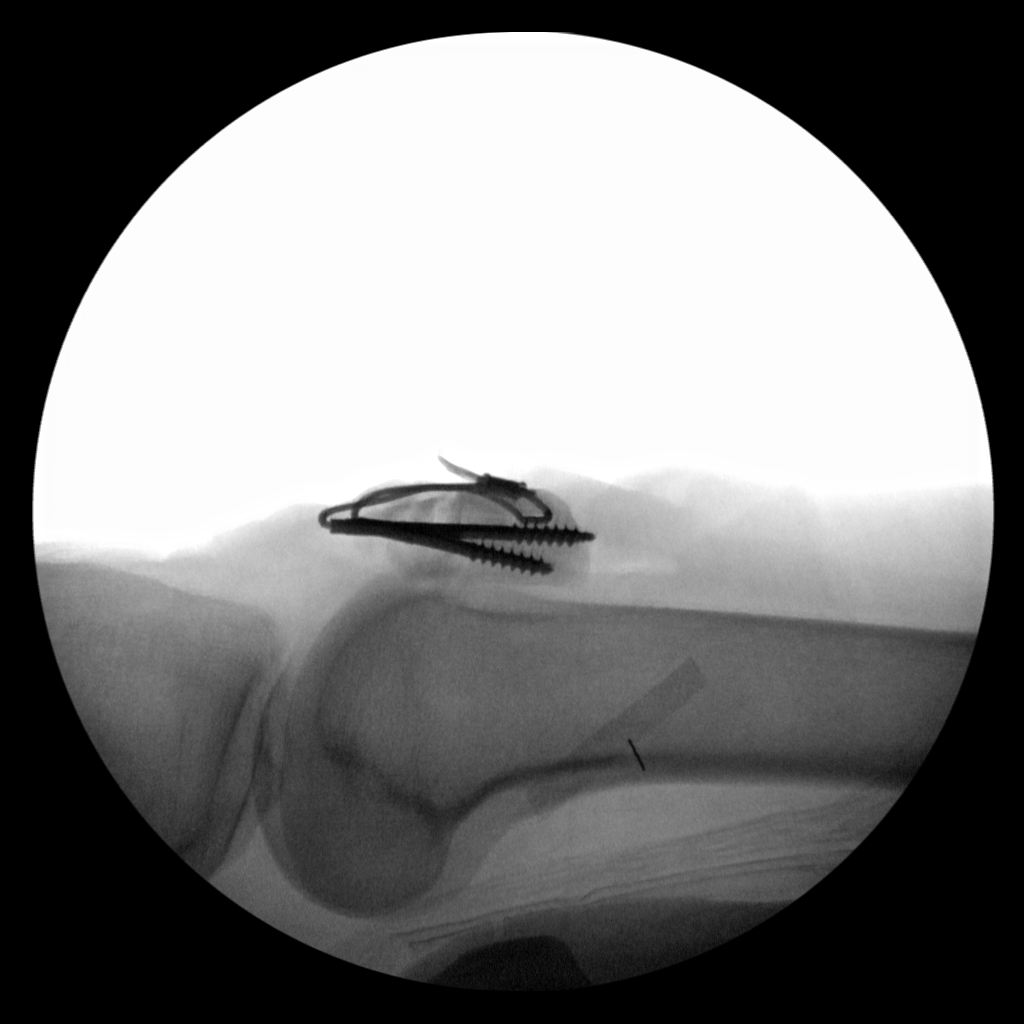
[im 2/3]
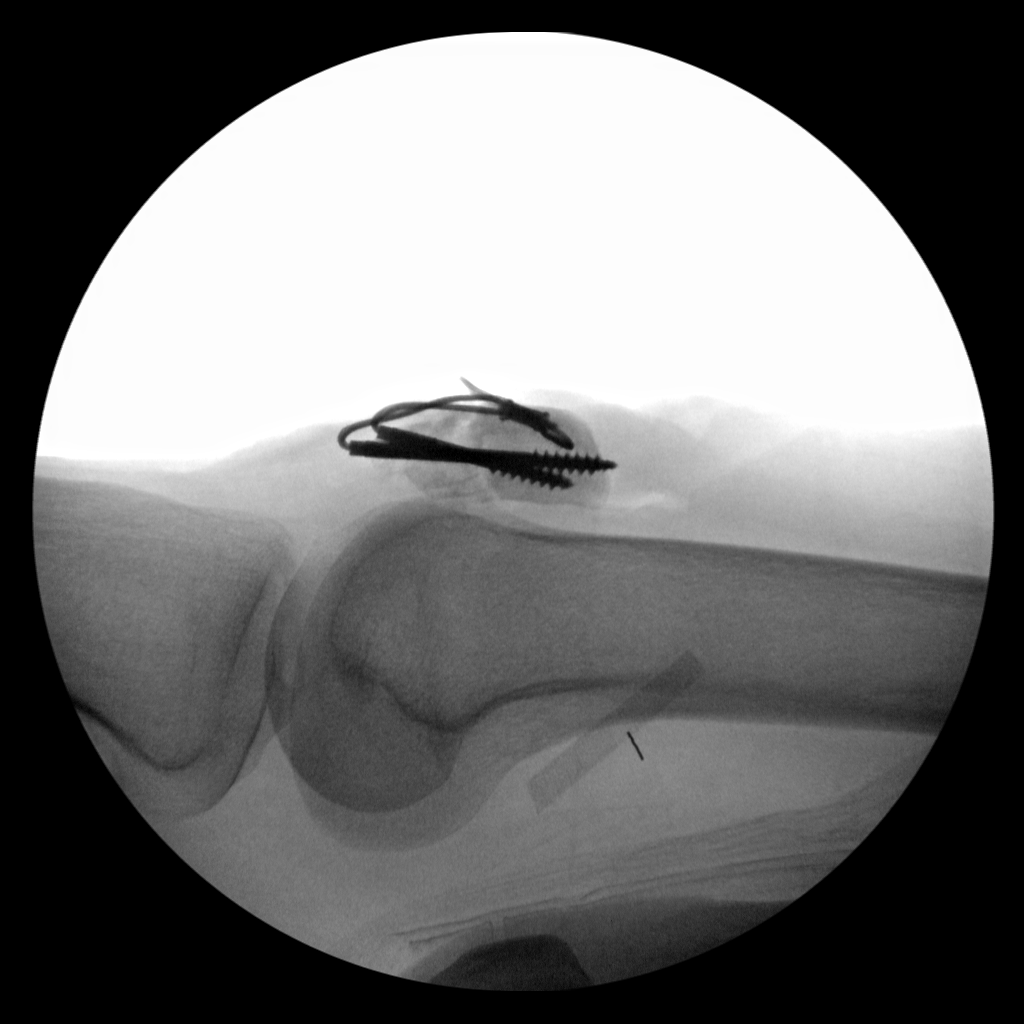
[im 3/3]
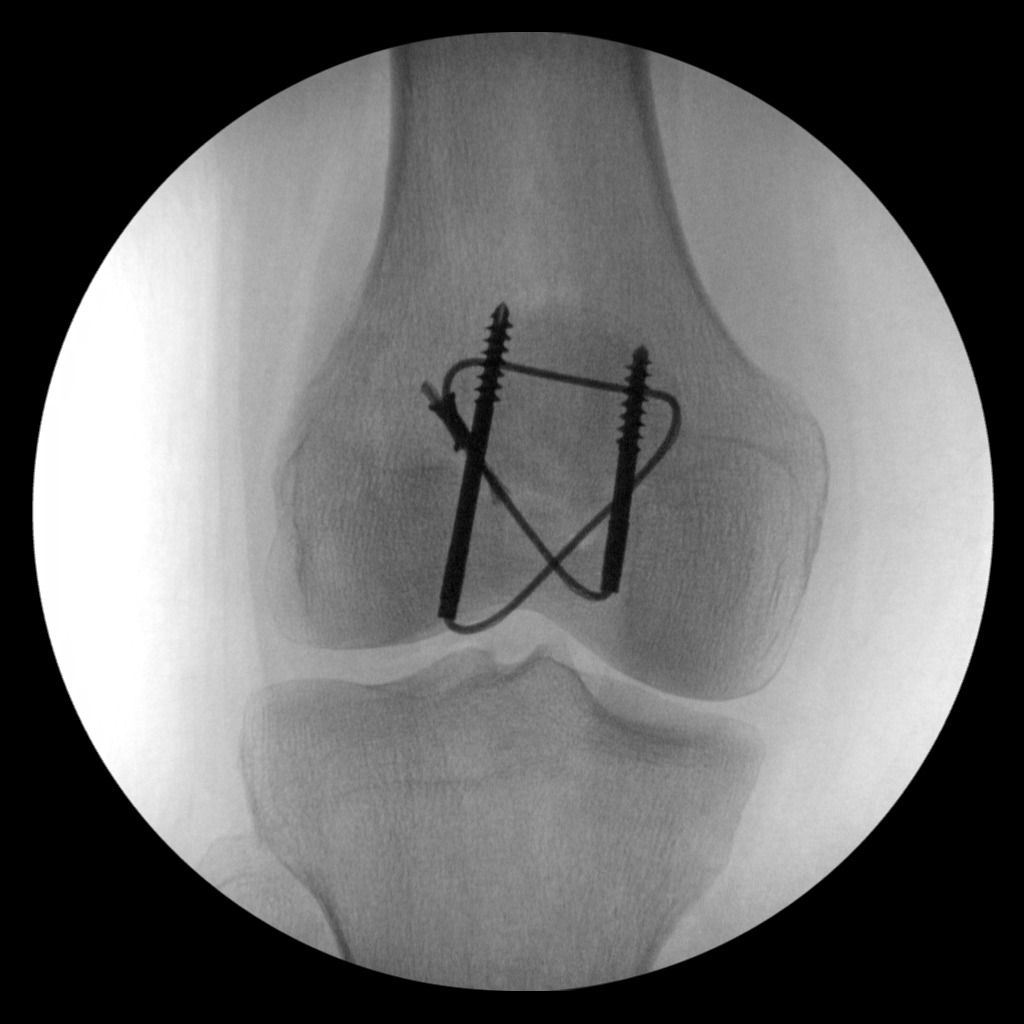

[3 of 3 positions shown; findings below may reference images not displayed]

FINDINGS: The patient has undergone wire fixation of the comminuted, displaced
fracture of the patella. Alignment is now near anatomic. There is no
postprocedure complication.
IMPRESSION: Successful reduction of a previous displaced fracture of the
patella.

## 2018-09-30 ENCOUNTER — Other Ambulatory Visit: Payer: Self-pay | Admitting: Internal Medicine

## 2018-09-30 DIAGNOSIS — Z1231 Encounter for screening mammogram for malignant neoplasm of breast: Secondary | ICD-10-CM

## 2018-10-02 ENCOUNTER — Ambulatory Visit
Admission: RE | Admit: 2018-10-02 | Discharge: 2018-10-02 | Disposition: A | Discharge status: Home / Self Care | Payer: No Typology Code available for payment source | Source: Ambulatory Visit | Attending: Internal Medicine | Admitting: Internal Medicine

## 2018-10-02 DIAGNOSIS — Z1231 Encounter for screening mammogram for malignant neoplasm of breast: Secondary | ICD-10-CM | POA: Diagnosis not present

## 2019-09-16 ENCOUNTER — Other Ambulatory Visit: Payer: Self-pay | Admitting: Internal Medicine

## 2019-09-16 DIAGNOSIS — Z1231 Encounter for screening mammogram for malignant neoplasm of breast: Secondary | ICD-10-CM

## 2019-10-08 ENCOUNTER — Ambulatory Visit
Admission: RE | Admit: 2019-10-08 | Discharge: 2019-10-08 | Disposition: A | Discharge status: Home / Self Care | Payer: BC Managed Care – PPO | Source: Ambulatory Visit | Attending: Internal Medicine | Admitting: Internal Medicine

## 2019-10-08 DIAGNOSIS — Z1231 Encounter for screening mammogram for malignant neoplasm of breast: Secondary | ICD-10-CM

## 2020-09-28 ENCOUNTER — Other Ambulatory Visit: Payer: Self-pay | Admitting: Internal Medicine

## 2020-09-28 DIAGNOSIS — Z1231 Encounter for screening mammogram for malignant neoplasm of breast: Secondary | ICD-10-CM

## 2020-11-04 ENCOUNTER — Ambulatory Visit
Admission: RE | Admit: 2020-11-04 | Discharge: 2020-11-04 | Disposition: A | Discharge status: Home / Self Care | Payer: Medicare Other | Source: Ambulatory Visit | Attending: Internal Medicine | Admitting: Internal Medicine

## 2020-11-04 ENCOUNTER — Other Ambulatory Visit: Payer: Self-pay

## 2020-11-04 DIAGNOSIS — Z1231 Encounter for screening mammogram for malignant neoplasm of breast: Secondary | ICD-10-CM | POA: Insufficient documentation

## 2021-05-11 LAB — EXTERNAL GENERIC LAB PROCEDURE: COLOGUARD: NEGATIVE

## 2021-12-12 ENCOUNTER — Other Ambulatory Visit: Payer: Self-pay | Admitting: Internal Medicine

## 2021-12-12 DIAGNOSIS — Z1231 Encounter for screening mammogram for malignant neoplasm of breast: Secondary | ICD-10-CM

## 2022-01-09 ENCOUNTER — Other Ambulatory Visit: Payer: Self-pay

## 2022-01-09 ENCOUNTER — Ambulatory Visit
Admission: RE | Admit: 2022-01-09 | Discharge: 2022-01-09 | Disposition: A | Discharge status: Home / Self Care | Payer: Medicare Other | Source: Ambulatory Visit | Attending: Internal Medicine | Admitting: Internal Medicine

## 2022-01-09 DIAGNOSIS — Z1231 Encounter for screening mammogram for malignant neoplasm of breast: Secondary | ICD-10-CM | POA: Insufficient documentation

## 2023-01-01 ENCOUNTER — Other Ambulatory Visit: Payer: Self-pay | Admitting: Internal Medicine

## 2023-01-01 DIAGNOSIS — Z1231 Encounter for screening mammogram for malignant neoplasm of breast: Secondary | ICD-10-CM

## 2023-01-22 ENCOUNTER — Ambulatory Visit
Admission: RE | Admit: 2023-01-22 | Discharge: 2023-01-22 | Disposition: A | Discharge status: Home / Self Care | Payer: Medicare Other | Source: Ambulatory Visit | Attending: Internal Medicine | Admitting: Internal Medicine

## 2023-01-22 DIAGNOSIS — Z1231 Encounter for screening mammogram for malignant neoplasm of breast: Secondary | ICD-10-CM

## 2024-01-21 ENCOUNTER — Other Ambulatory Visit: Payer: Self-pay | Admitting: Internal Medicine

## 2024-01-21 DIAGNOSIS — Z1231 Encounter for screening mammogram for malignant neoplasm of breast: Secondary | ICD-10-CM

## 2024-01-30 ENCOUNTER — Ambulatory Visit
Admission: RE | Admit: 2024-01-30 | Discharge: 2024-01-30 | Disposition: A | Discharge status: Home / Self Care | Payer: Medicare Other | Source: Ambulatory Visit | Attending: Internal Medicine | Admitting: Internal Medicine

## 2024-01-30 DIAGNOSIS — Z1231 Encounter for screening mammogram for malignant neoplasm of breast: Secondary | ICD-10-CM | POA: Insufficient documentation

## 2024-03-17 ENCOUNTER — Ambulatory Visit: Payer: Medicare Other | Admitting: Dermatology

## 2024-03-17 DIAGNOSIS — D492 Neoplasm of unspecified behavior of bone, soft tissue, and skin: Secondary | ICD-10-CM

## 2024-03-17 DIAGNOSIS — D229 Melanocytic nevi, unspecified: Secondary | ICD-10-CM

## 2024-03-17 DIAGNOSIS — D489 Neoplasm of uncertain behavior, unspecified: Secondary | ICD-10-CM

## 2024-03-17 DIAGNOSIS — D1801 Hemangioma of skin and subcutaneous tissue: Secondary | ICD-10-CM

## 2024-03-17 DIAGNOSIS — L84 Corns and callosities: Secondary | ICD-10-CM

## 2024-03-17 DIAGNOSIS — L820 Inflamed seborrheic keratosis: Secondary | ICD-10-CM | POA: Diagnosis not present

## 2024-03-17 DIAGNOSIS — L578 Other skin changes due to chronic exposure to nonionizing radiation: Secondary | ICD-10-CM

## 2024-03-17 DIAGNOSIS — Z1283 Encounter for screening for malignant neoplasm of skin: Secondary | ICD-10-CM | POA: Diagnosis not present

## 2024-03-17 DIAGNOSIS — L738 Other specified follicular disorders: Secondary | ICD-10-CM

## 2024-03-17 DIAGNOSIS — L814 Other melanin hyperpigmentation: Secondary | ICD-10-CM

## 2024-03-17 DIAGNOSIS — W908XXA Exposure to other nonionizing radiation, initial encounter: Secondary | ICD-10-CM

## 2024-03-17 DIAGNOSIS — D239 Other benign neoplasm of skin, unspecified: Secondary | ICD-10-CM

## 2024-03-17 DIAGNOSIS — L720 Epidermal cyst: Secondary | ICD-10-CM

## 2024-03-17 DIAGNOSIS — L821 Other seborrheic keratosis: Secondary | ICD-10-CM

## 2024-03-17 NOTE — Progress Notes (Signed)
 New Patient Visit   Subjective  Shelly Heath is a 83 y.o. female who presents for the following: Skin Cancer Screening and Full Body Skin Exam No personal history of skin cancer  Spot under left breast that she noticed and would like checked that has increase in size- gets irritated.  Also has itchy spots at braline on right side, and irritated bumps by nose.   The patient presents for Total-Body Skin Exam (TBSE) for skin cancer screening and mole check. The patient has spots, moles and lesions to be evaluated, some may be new or changing and the patient may have concern these could be cancer.    The following portions of the chart were reviewed this encounter and updated as appropriate: medications, allergies, medical history  Review of Systems:  No other skin or systemic complaints except as noted in HPI or Assessment and Plan.  Objective  Well appearing patient in no apparent distress; mood and affect are within normal limits.  A full examination was performed including scalp, head, eyes, ears, nose, lips, neck, chest, axillae, abdomen, back, buttocks, bilateral upper extremities, bilateral lower extremities, hands, feet, fingers, toes, fingernails, and toenails. All findings within normal limits unless otherwise noted below.   Relevant physical exam findings are noted in the Assessment and Plan.  left upper abdomen 0.6 cm firm pink white papule   right upper flank at braline x 2 (2) Erythematous stuck-on, waxy papule  Assessment & Plan   SKIN CANCER SCREENING PERFORMED TODAY.  ACTINIC DAMAGE - Chronic condition, secondary to cumulative UV/sun exposure - diffuse scaly erythematous macules with underlying dyspigmentation - Recommend daily broad spectrum sunscreen SPF 30+ to sun-exposed areas, reapply every 2 hours as needed.  - Staying in the shade or wearing long sleeves, sun glasses (UVA+UVB protection) and wide brim hats (4-inch brim around the entire circumference of  the hat) are also recommended for sun protection.  - Call for new or changing lesions.  LENTIGINES, SEBORRHEIC KERATOSES, HEMANGIOMAS - Benign normal skin lesions - Benign-appearing - Call for any changes  MELANOCYTIC NEVI - Tan-brown and/or pink-flesh-colored symmetric macules and papules - Benign appearing on exam today - Observation - Call clinic for new or changing moles - Recommend daily use of broad spectrum spf 30+ sunscreen to sun-exposed areas.    Dilated Pore of Winer/cyst Exam:  - left posterior lower neck Dilated pore with surrounding mild erythema   Benign-appearing. Exam most consistent with an epidermal inclusion cyst. Discussed that a cyst is a benign growth that can grow over time and sometimes get irritated or inflamed. Recommend observation if it is not bothersome. Discussed option of surgical excision to remove it if it is growing, symptomatic, or other changes noted. Please call for new or changing lesions so they can be evaluated.  MILIA Exam: tiny erythematous firm white papules right paranasal x 4  Discussed this is a type of cyst. Benign-appearing. Sometimes these will clear with OTC adapalene/Differin 0.1% cream QHS or retinol.  Discussed extraction if symptomatic.  Treatment Plan: Symptomatic, irritating, patient would like treated.   Benign destruction Procedure risks and benefits were discussed with the patient including bruising and verbal consent was obtained. Following prep of the skin of the R paranasal with an alcohol swab, area was injected with 1% lidocaine /epinephrine , extraction of milia x 4 lesions was performed with cotton tip applicators and comedone extractor following superficial incision made over their surfaces with a #11 surgical blade. Capillary hemostasis was achieved with 20% aluminum  chloride solution. Vaseline ointment was applied to each site. The patient tolerated the procedure well.  NEOPLASM OF UNCERTAIN BEHAVIOR left upper  abdomen Epidermal / dermal shaving  Lesion diameter (cm):  0.6 Informed consent: discussed and consent obtained   Patient was prepped and draped in usual sterile fashion: Area prepped with alcohol. Anesthesia: the lesion was anesthetized in a standard fashion   Anesthetic:  1% lidocaine  w/ epinephrine  1-100,000 buffered w/ 8.4% NaHCO3 Instrument used: flexible razor blade   Hemostasis achieved with: pressure, aluminum chloride and electrodesiccation   Outcome: patient tolerated procedure well   Post-procedure details: wound care instructions given   Post-procedure details comment:  Ointment and small bandage applied.  Specimen 1 - Surgical pathology Differential Diagnosis: R/o epidermal inclusion cyst vs other   Check Margins: No Symptomatic, irritating, patient would like treated.  R/o epidermal inclusion cyst vs other  INFLAMED SEBORRHEIC KERATOSIS (2) right upper flank at braline x 2 (2) Symptomatic, irritating, patient would like treated. Destruction of lesion - right upper flank at braline x 2 (2)  Destruction method: cryotherapy   Informed consent: discussed and consent obtained   Lesion destroyed using liquid nitrogen: Yes   Region frozen until ice ball extended beyond lesion: Yes   Outcome: patient tolerated procedure well with no complications   Post-procedure details: wound care instructions given   Additional details:  Prior to procedure, discussed risks of blister formation, small wound, skin dyspigmentation, or rare scar following cryotherapy. Recommend Vaseline ointment to treated areas while healing.   Callus at right plantar foot at ball Exam: hyperkeratotic plaque  Treatment Plan: Benign, observe.   Minimize pressure to area if possible Recommend using Curad Mediplast pads. Cut to fit wart or callus. Cover with Elastoplast waterproof tape or any waterproof band-aid. Change every 3 to 4 days, or sooner if necessary.  Treatment may require several months of  regular use before results are seen.    Return in about 1 year (around 03/17/2025) for TBSE.  I, Randee Busing, CMA, am acting as scribe for Artemio Larry, MD.  Documentation: I have reviewed the above documentation for accuracy and completeness, and I agree with the above.  Artemio Larry, MD

## 2024-03-17 NOTE — Patient Instructions (Addendum)
 For callus at right bottom of foot  Recommend using Curad Mediplast pads. Cut to fit wart or callus. Cover with Elastoplast waterproof tape or any waterproof band-aid. Change every 3 to 4 days, or sooner if necessary.  Treatment may require several months of regular use before results are seen.    Biopsy Wound Care Instructions  Leave the original bandage on for 24 hours if possible.  If the bandage becomes soaked or soiled before that time, it is OK to remove it and examine the wound.  A small amount of post-operative bleeding is normal.  If excessive bleeding occurs, remove the bandage, place gauze over the site and apply continuous pressure (no peeking) over the area for 30 minutes. If this does not work, please call our clinic as soon as possible or page your doctor if it is after hours.   Once a day, cleanse the wound with soap and water. It is fine to shower. If a thick crust develops you may use a Q-tip dipped into dilute hydrogen peroxide (mix 1:1 with water) to dissolve it.  Hydrogen peroxide can slow the healing process, so use it only as needed.    After washing, apply petroleum jelly (Vaseline) or an antibiotic ointment if your doctor prescribed one for you, followed by a bandage.    For best healing, the wound should be covered with a layer of ointment at all times. If you are not able to keep the area covered with a bandage to hold the ointment in place, this may mean re-applying the ointment several times a day.  Continue this wound care until the wound has healed and is no longer open.   Itching and mild discomfort is normal during the healing process. However, if you develop pain or severe itching, please call our office.   If you have any discomfort, you can take Tylenol  (acetaminophen ) or ibuprofen as directed on the bottle. (Please do not take these if you have an allergy to them or cannot take them for another reason).  Some redness, tenderness and white or yellow material in  the wound is normal healing.  If the area becomes very sore and red, or develops a thick yellow-green material (pus), it may be infected; please notify us .    If you have stitches, return to clinic as directed to have the stitches removed. You will continue wound care for 2-3 days after the stitches are removed.   Wound healing continues for up to one year following surgery. It is not unusual to experience pain in the scar from time to time during the interval.  If the pain becomes severe or the scar thickens, you should notify the office.    A slight amount of redness in a scar is expected for the first six months.  After six months, the redness will fade and the scar will soften and fade.  The color difference becomes less noticeable with time.  If there are any problems, return for a post-op surgery check at your earliest convenience.  To improve the appearance of the scar, you can use silicone scar gel, cream, or sheets (such as Mederma or Serica) every night for up to one year. These are available over the counter (without a prescription).  Please call our office at [phone] for any questions or concerns.     Seborrheic Keratosis  What causes seborrheic keratoses? Seborrheic keratoses are harmless, common skin growths that first appear during adult life.  As time goes by, more growths  appear.  Some people may develop a large number of them.  Seborrheic keratoses appear on both covered and uncovered body parts.  They are not caused by sunlight.  The tendency to develop seborrheic keratoses can be inherited.  They vary in color from skin-colored to gray, brown, or even black.  They can be either smooth or have a rough, warty surface.   Seborrheic keratoses are superficial and look as if they were stuck on the skin.  Under the microscope this type of keratosis looks like layers upon layers of skin.  That is why at times the top layer may seem to fall off, but the rest of the growth remains  and re-grows.    Treatment Seborrheic keratoses do not need to be treated, but can easily be removed in the office.  Seborrheic keratoses often cause symptoms when they rub on clothing or jewelry.  Lesions can be in the way of shaving.  If they become inflamed, they can cause itching, soreness, or burning.  Removal of a seborrheic keratosis can be accomplished by freezing, burning, or surgery. If any spot bleeds, scabs, or grows rapidly, please return to have it checked, as these can be an indication of a skin cancer.    Melanoma ABCDEs  Melanoma is the most dangerous type of skin cancer, and is the leading cause of death from skin disease.  You are more likely to develop melanoma if you: Have light-colored skin, light-colored eyes, or red or blond hair Spend a lot of time in the sun Tan regularly, either outdoors or in a tanning bed Have had blistering sunburns, especially during childhood Have a close family member who has had a melanoma Have atypical moles or large birthmarks  Early detection of melanoma is key since treatment is typically straightforward and cure rates are extremely high if we catch it early.   The first sign of melanoma is often a change in a mole or a new dark spot.  The ABCDE system is a way of remembering the signs of melanoma.  A for asymmetry:  The two halves do not match. B for border:  The edges of the growth are irregular. C for color:  A mixture of colors are present instead of an even brown color. D for diameter:  Melanomas are usually (but not always) greater than 6mm - the size of a pencil eraser. E for evolution:  The spot keeps changing in size, shape, and color.  Please check your skin once per month between visits. You can use a small mirror in front and a large mirror behind you to keep an eye on the back side or your body.   If you see any new or changing lesions before your next follow-up, please call to schedule a visit.  Please continue daily  skin protection including broad spectrum sunscreen SPF 30+ to sun-exposed areas, reapplying every 2 hours as needed when you're outdoors.   Staying in the shade or wearing long sleeves, sun glasses (UVA+UVB protection) and wide brim hats (4-inch brim around the entire circumference of the hat) are also recommended for sun protection.       Due to recent changes in healthcare laws, you may see results of your pathology and/or laboratory studies on MyChart before the doctors have had a chance to review them. We understand that in some cases there may be results that are confusing or concerning to you. Please understand that not all results are received at the same time and  often the doctors may need to interpret multiple results in order to provide you with the best plan of care or course of treatment. Therefore, we ask that you please give us  2 business days to thoroughly review all your results before contacting the office for clarification. Should we see a critical lab result, you will be contacted sooner.   If You Need Anything After Your Visit  If you have any questions or concerns for your doctor, please call our main line at [phone] and press option 4 to reach your doctor's medical assistant. If no one answers, please leave a voicemail as directed and we will return your call as soon as possible. Messages left after 4 pm will be answered the following business day.   You may also send us  a message via MyChart. We typically respond to MyChart messages within 1-2 business days.  For prescription refills, please ask your pharmacy to contact our office. Our fax number is [phone].  If you have an urgent issue when the clinic is closed that cannot wait until the next business day, you can page your doctor at the number below.    Please note that while we do our best to be available for urgent issues outside of office hours, we are not available 24/7.   If you have an urgent issue and  are unable to reach us , you may choose to seek medical care at your doctor's office, retail clinic, urgent care center, or emergency room.  If you have a medical emergency, please immediately call 911 or go to the emergency department.  Pager Numbers  - Dr. Bary Likes: [phone]  - Dr. Annette Barters: [phone]  - Dr. Felipe Horton: [phone]   In the event of inclement weather, please call our main line at [phone] for an update on the status of any delays or closures.  Dermatology Medication Tips: Please keep the boxes that topical medications come in in order to help keep track of the instructions about where and how to use these. Pharmacies typically print the medication instructions only on the boxes and not directly on the medication tubes.   If your medication is too expensive, please contact our office at [phone] option 4 or send us  a message through MyChart.   We are unable to tell what your co-pay for medications will be in advance as this is different depending on your insurance coverage. However, we may be able to find a substitute medication at lower cost or fill out paperwork to get insurance to cover a needed medication.   If a prior authorization is required to get your medication covered by your insurance company, please allow us  1-2 business days to complete this process.  Drug prices often vary depending on where the prescription is filled and some pharmacies may offer cheaper prices.  The website www.goodrx.com contains coupons for medications through different pharmacies. The prices here do not account for what the cost may be with help from insurance (it may be cheaper with your insurance), but the website can give you the price if you did not use any insurance.  - You can print the associated coupon and take it with your prescription to the pharmacy.  - You may also stop by our office during regular business hours and pick up a GoodRx coupon card.  - If you need  your prescription sent electronically to a different pharmacy, notify our office through [facility] or by phone at [phone] option 4.     Si  Usted Necesita Algo Despus de Su Visita  Tambin puede enviarnos un mensaje a travs de Clinical cytogeneticist. Por lo general respondemos a los mensajes de MyChart en el transcurso de 1 a 2 das hbiles.  Para renovar recetas, por favor pida a su farmacia que se ponga en contacto con nuestra oficina. Franz Jacks de fax es [place] [phone].  Si tiene un asunto urgente cuando la clnica est cerrada y que no puede esperar hasta el siguiente da hbil, puede llamar/localizar a su doctor(a) al nmero que aparece a continuacin.   Por favor, tenga en cuenta que aunque hacemos todo lo posible para estar disponibles para asuntos urgentes fuera del horario de [place], no estamos disponibles las 24 horas del da, los 7 [address]  [address] de la [place].   Si tiene un problema urgente y no puede comunicarse con nosotros, puede optar por buscar atencin mdica  en el consultorio de su doctor(a), en una clnica privada, en un centro de atencin urgente o en una sala de emergencias.  Si tiene Engineer, drilling, por favor llame inmediatamente al 911 o vaya a la sala de emergencias.  Nmeros de bper  - Dr. Bary Likes: [phone]  - Dra. Annette Barters: [phone]  - Dr. Felipe Horton: [phone]   En caso de inclemencias del tiempo, por favor llame a Lajuan Pila principal al [phone] para una actualizacin sobre el [place] de cualquier retraso o cierre.  Consejos para la medicacin en dermatologa: Por favor, guarde las cajas en las que vienen los medicamentos de uso tpico para ayudarle a seguir las instrucciones sobre dnde y cmo usarlos. Las farmacias generalmente imprimen las instrucciones del medicamento slo en las cajas y no directamente en los tubos del [place].   Si su medicamento es muy caro, por favor, pngase en contacto con Bettyjane Brunet llamando al  [phone] y presione la opcin 4 o envenos un mensaje a travs de Clinical cytogeneticist.   No podemos decirle cul ser su copago por los medicamentos por adelantado ya que esto es diferente dependiendo de la cobertura de su seguro. Sin embargo, es posible que podamos encontrar un medicamento sustituto a Audiological scientist un formulario para que el seguro cubra el medicamento que se considera necesario.   Si se requiere una autorizacin previa para que su compaa de seguros [place] su medicamento, por favor permtanos de 1 a 2 das hbiles para completar este proceso.  Los precios de los medicamentos varan con frecuencia dependiendo del Environmental consultant de dnde se surte la receta y alguna farmacias pueden ofrecer precios ms baratos.  El sitio web www.goodrx.com tiene cupones para medicamentos de Health and safety inspector. Los precios aqu no tienen en cuenta lo que podra costar con la ayuda del seguro (puede ser ms barato con su seguro), pero el sitio web puede darle el precio si no utiliz Tourist information centre manager.  - Puede imprimir el cupn correspondiente y llevarlo con su receta a la farmacia.  - Tambin puede pasar por nuestra oficina durante el horario de atencin regular y Education officer, museum una tarjeta de cupones de GoodRx.  - Si necesita que su receta se enve electrnicamente a una farmacia diferente, informe a nuestra oficina a travs de MyChart de [place] o por telfono llamando al [phone] y presione la opcin 4.

## 2024-03-23 LAB — SURGICAL PATHOLOGY

## 2024-03-24 ENCOUNTER — Telehealth: Payer: Self-pay

## 2024-03-24 NOTE — Telephone Encounter (Signed)
 Advised patient biopsy of the left upper abdomen was benign cyst, no further treatment needed.

## 2024-03-24 NOTE — Telephone Encounter (Signed)
-----   Message from Artemio Larry sent at 03/23/2024  8:49 PM EDT ----- 1. Skin, left upper abdomen :       EPIDERMAL INCLUSION CYST   Benign cyst - please call patient

## 2025-04-06 ENCOUNTER — Encounter: Admitting: Dermatology
